# Patient Record
Sex: Female | Born: 1980 | Race: Black or African American | Hispanic: No | Marital: Single | Smoking: Never smoker
Health system: Southern US, Community
[De-identification: ages and names within clinical notes are randomized; demographics above are authoritative.]

---

## 2009-10-06 ENCOUNTER — Emergency Department (HOSPITAL_COMMUNITY): Admission: EM | Admit: 2009-10-06 | Discharge: 2009-10-06 | Payer: Self-pay | Admitting: Emergency Medicine

## 2014-09-02 ENCOUNTER — Encounter (HOSPITAL_BASED_OUTPATIENT_CLINIC_OR_DEPARTMENT_OTHER): Payer: Self-pay

## 2014-09-02 ENCOUNTER — Emergency Department (HOSPITAL_BASED_OUTPATIENT_CLINIC_OR_DEPARTMENT_OTHER): Payer: Medicaid Other

## 2014-09-02 ENCOUNTER — Emergency Department (HOSPITAL_BASED_OUTPATIENT_CLINIC_OR_DEPARTMENT_OTHER)
Admission: EM | Admit: 2014-09-02 | Discharge: 2014-09-02 | Disposition: A | Discharge status: Home / Self Care | Payer: Medicaid Other | Attending: Emergency Medicine | Admitting: Emergency Medicine

## 2014-09-02 DIAGNOSIS — O034 Incomplete spontaneous abortion without complication: Secondary | ICD-10-CM

## 2014-09-02 DIAGNOSIS — N939 Abnormal uterine and vaginal bleeding, unspecified: Secondary | ICD-10-CM

## 2014-09-02 DIAGNOSIS — IMO0002 Reserved for concepts with insufficient information to code with codable children: Secondary | ICD-10-CM

## 2014-09-02 LAB — URINALYSIS, ROUTINE W REFLEX MICROSCOPIC
Bilirubin Urine: NEGATIVE
GLUCOSE, UA: NEGATIVE mg/dL
KETONES UR: NEGATIVE mg/dL
LEUKOCYTES UA: NEGATIVE
NITRITE: NEGATIVE
Protein, ur: NEGATIVE mg/dL
Specific Gravity, Urine: 1.023 (ref 1.005–1.030)
Urobilinogen, UA: 1 mg/dL (ref 0.0–1.0)
pH: 5.5 (ref 5.0–8.0)

## 2014-09-02 LAB — CBC
HCT: 32.8 % — ABNORMAL LOW (ref 36.0–46.0)
Hemoglobin: 11.1 g/dL — ABNORMAL LOW (ref 12.0–15.0)
MCH: 26.9 pg (ref 26.0–34.0)
MCHC: 33.8 g/dL (ref 30.0–36.0)
MCV: 79.6 fL (ref 78.0–100.0)
Platelets: 345 10*3/uL (ref 150–400)
RBC: 4.12 MIL/uL (ref 3.87–5.11)
RDW: 14.8 % (ref 11.5–15.5)
WBC: 10.6 10*3/uL — ABNORMAL HIGH (ref 4.0–10.5)

## 2014-09-02 LAB — BASIC METABOLIC PANEL
Anion gap: 6 (ref 5–15)
BUN: 10 mg/dL (ref 6–23)
CALCIUM: 8.8 mg/dL (ref 8.4–10.5)
CO2: 21 mmol/L (ref 19–32)
Chloride: 109 mmol/L (ref 96–112)
Creatinine, Ser: 0.65 mg/dL (ref 0.50–1.10)
GLUCOSE: 99 mg/dL (ref 70–99)
POTASSIUM: 3.6 mmol/L (ref 3.5–5.1)
Sodium: 136 mmol/L (ref 135–145)

## 2014-09-02 LAB — URINE MICROSCOPIC-ADD ON

## 2014-09-02 LAB — HCG, QUANTITATIVE, PREGNANCY: hCG, Beta Chain, Quant, S: 846 m[IU]/mL — ABNORMAL HIGH (ref <5)

## 2014-09-02 MED ORDER — IBUPROFEN 800 MG PO TABS
800.0000 mg | ORAL_TABLET | Freq: Once | ORAL | Status: AC
  Administered 2014-09-02: 800 mg via ORAL
  Filled 2014-09-02: qty 1

## 2014-09-02 NOTE — ED Notes (Signed)
Pt wanting to know when is her u/s. Pt has drank maybe 2 oz of fluid and states she cannot drink anymore. Pt encouraged to drink.

## 2014-09-02 NOTE — ED Provider Notes (Signed)
CSN: [id number]     Arrival date & time 09/02/14  1335 History   First MD Initiated Contact with Patient 09/02/14 1405     Chief Complaint  Patient presents with  . Vaginal Bleeding     (Consider location/radiation/quality/duration/timing/severity/associated sxs/prior Treatment) HPI Comments: 34 year old female presenting with vaginal bleeding 1 day. Patient reports she had an elective [facility] 11 days [facility] [place] She had some brown spotting after the procedure, went away, and today, noticed the blood was "more red" with large clots. She tried calling [facility] and was advised to go to the emergency department. She was not scheduled follow-up. Admits to associated lower abdominal pain. Denies fever, chills, n/v/d.  Patient is a 34 y.o. female presenting with vaginal bleeding. The history is provided by the patient.  Vaginal Bleeding Associated symptoms: abdominal pain     History reviewed. No pertinent past medical history. History reviewed. No pertinent past surgical history. No family history on file. History  Substance Use Topics  . Smoking status: Never Smoker   . Smokeless tobacco: Not on file  . Alcohol Use: Yes   OB History    Gravida Para Term Preterm AB TAB SAB Ectopic Multiple Living   1              Review of Systems  Gastrointestinal: Positive for abdominal pain.  Genitourinary: Positive for vaginal bleeding.  All other systems reviewed and are negative.     Allergies  Review of patient's allergies indicates no known allergies.  Home Medications   Prior to Admission medications   Not on File   BP 113/60 mmHg  Pulse 73  Temp(Src) 98.8 F (37.1 C) (Oral)  Resp 16  Ht 5\' 3"  (1.6 m)  Wt 144 lb (65.318 kg)  BMI 25.51 kg/m2  SpO2 99%  LMP 06/02/2014 (Approximate) Physical Exam  Constitutional: She is oriented to person, place, and time. She appears well-developed and well-nourished. No distress.   HENT:  Head: Normocephalic and atraumatic.  Mouth/Throat: Oropharynx is clear and moist.  Eyes: Conjunctivae and EOM are normal.  Neck: Normal range of motion. Neck supple.  Cardiovascular: Normal rate, regular rhythm and normal heart sounds.   Pulmonary/Chest: Effort normal and breath sounds normal. No respiratory distress.  Abdominal: There is tenderness in the suprapubic area. There is no rigidity, no rebound and no guarding.  Genitourinary: Cervix exhibits no motion tenderness. There is bleeding (large clots) in the vagina.  Musculoskeletal: Normal range of motion. She exhibits no edema.  Neurological: She is alert and oriented to person, place, and time. No sensory deficit.  Skin: Skin is warm and dry.  Psychiatric: She has a normal mood and affect. Her behavior is normal.  Nursing note and vitals reviewed.   ED Course  Procedures (including critical care time) Labs Review Labs Reviewed  URINALYSIS, ROUTINE W REFLEX MICROSCOPIC - Abnormal; Notable for the following:    Hgb urine dipstick LARGE (*)    All other components within normal limits  CBC - Abnormal; Notable for the following:    WBC 10.6 (*)    Hemoglobin 11.1 (*)    HCT 32.8 (*)    All other components within normal limits  HCG, QUANTITATIVE, PREGNANCY - Abnormal; Notable for the following:    hCG, Beta Chain, Quant, S 846 (*)    All other components within normal limits  BASIC METABOLIC PANEL  URINE MICROSCOPIC-ADD ON    Imaging Review [place] Transvaginal Non-ob  09/02/2014   CLINICAL DATA:  Heavy vaginal bleeding.  EXAM: TRANSABDOMINAL AND TRANSVAGINAL ULTRASOUND OF PELVIS  TECHNIQUE: Both transabdominal and transvaginal ultrasound examinations of the pelvis were performed. Transabdominal technique was performed for global imaging of the pelvis including uterus, ovaries, adnexal regions, and pelvic cul-de-sac. It was necessary to proceed with endovaginal exam following the transabdominal exam to visualize the  endometrium and ovaries.  COMPARISON:  None  FINDINGS: Uterus  Measurements: 9.8 x 5.2 x 5.8 cm. No fibroids or other mass visualized.  Endometrium  Thickness: 18 mm. Heterogeneous and appearance. There is increased blood pro identified throughout the endometrium.  Right ovary  Measurements: 3.3 x 2.7 x 2.7 cm. The dominant follicle measures 12 mm.  Left ovary  Measurements: 3 x 1.4 x 2.0 cm. Normal appearance/no adnexal mass.  Other findings  No free fluid.  IMPRESSION: 1. Thickened and heterogeneous appearing endometrium with increased blood flow. Cannot cannot exclude retained products of conception.   Electronically Signed   By: Signa Kell M.D.   On: 09/02/2014 17:09   US Pelvis Complete  09/02/2014   CLINICAL DATA:  Heavy vaginal bleeding.  EXAM: TRANSABDOMINAL AND TRANSVAGINAL ULTRASOUND OF PELVIS  TECHNIQUE: Both transabdominal and transvaginal ultrasound examinations of the pelvis were performed. Transabdominal technique was performed for global imaging of the pelvis including uterus, ovaries, adnexal regions, and pelvic cul-de-sac. It was necessary to proceed with endovaginal exam following the transabdominal exam to visualize the endometrium and ovaries.  COMPARISON:  None  FINDINGS: Uterus  Measurements: 9.8 x 5.2 x 5.8 cm. No fibroids or other mass visualized.  Endometrium  Thickness: 18 mm. Heterogeneous and appearance. There is increased blood pro identified throughout the endometrium.  Right ovary  Measurements: 3.3 x 2.7 x 2.7 cm. The dominant follicle measures 12 mm.  Left ovary  Measurements: 3 x 1.4 x 2.0 cm. Normal appearance/no adnexal mass.  Other findings  No free fluid.  IMPRESSION: 1. Thickened and heterogeneous appearing endometrium with increased blood flow. Cannot cannot exclude retained products of conception.   Electronically Signed   By: Signa Kell M.D.   On: 09/02/2014 17:09     EKG Interpretation None      MDM   Final diagnoses:  Postoperative vaginal bleeding   Retained products of conception following abortion   Nontoxic appearing and in no apparent distress. Afebrile, vital signs stable. Abdomen soft with mild suprapubic tenderness. I was unable to get in touch with the staff at [facility] in [place]. Beta hCG 846. Ultrasound showing thickened and heterogeneous appearing endometrium with increased blood flow, cannot exclude retained products of conception. I discussed these findings with patient, and [facility], however patient states she needs to leave the emergency department to pick up her child, and will [facility] with [facility], if she cannot be seen there, she will go to [facility]. She is stable for discharge. Return precautions given. Patient states understanding of treatment care plan and is agreeable.   Kathrynn Speed, PA-C 09/02/14 1721  Tilden Fossa, MD 09/03/14 [phone]

## 2014-09-02 NOTE — ED Notes (Signed)
Elective abortion 2/12-vaginal bleeding x 1 day then light-heavy vaginal bleeding started again today

## 2014-09-02 NOTE — Discharge Instructions (Signed)
Follow-up with [facility] as soon as possible. If you cannot, follow-up at [facility] as soon as possible.  Dysfunctional Uterine Bleeding Normally, menstrual periods begin between ages 75 to 60 in young women. A normal menstrual cycle/period may begin every 23 days up to 35 days and lasts from 1 to 7 days. Around 12 to 14 days before your menstrual period starts, ovulation (ovary produces an egg) occurs. When counting the time between menstrual periods, count from the first day of bleeding of the previous period to the first day of bleeding of the next period. Dysfunctional (abnormal) uterine bleeding is bleeding that is different from a normal menstrual period. Your periods may come earlier or later than usual. They may be lighter, have blood clots or be heavier. You may have bleeding between periods, or you may skip one period or more. You may have bleeding after sexual intercourse, bleeding after menopause, or no menstrual period. CAUSES   Pregnancy (normal, miscarriage, tubal).  IUDs (intrauterine device, birth control).  Birth control pills.  Hormone treatment.  Menopause.  Infection of the cervix.  Blood clotting problems.  Infection of the inside lining of the uterus.  Endometriosis, inside lining of the uterus growing in the pelvis and other female organs.  Adhesions (scar tissue) inside the uterus.  Obesity or severe weight loss.  Uterine polyps inside the uterus.  Cancer of the vagina, cervix, or uterus.  Ovarian cysts or polycystic ovary syndrome.  Medical problems (diabetes, thyroid disease).  Uterine fibroids (noncancerous tumor).  Problems with your female hormones.  Endometrial hyperplasia, very thick lining and enlarged cells inside of the uterus.  Medicines that interfere with ovulation.  Radiation to the pelvis or abdomen.  Chemotherapy. DIAGNOSIS   Your doctor will discuss the history of your menstrual periods, medicines you are  taking, changes in your weight, stress in your life, and any medical problems you may have.  Your doctor will do a physical and pelvic examination.  Your doctor may want to perform certain tests to make a diagnosis, such as:  Pap test.  Blood tests.  Cultures for infection.  CT scan.  Ultrasound.  Hysteroscopy.  Laparoscopy.  MRI.  Hysterosalpingography.  D and C.  Endometrial biopsy. TREATMENT  Treatment will depend on the cause of the dysfunctional uterine bleeding (DUB). Treatment may include:  Observing your menstrual periods for a couple of months.  Prescribing medicines for medical problems, including:  Antibiotics.  Hormones.  Birth control pills.  Removing an IUD (intrauterine device, birth control).  Surgery:  D and C (scrape and remove tissue from inside the uterus).  Laparoscopy (examine inside the abdomen with a lighted tube).  Uterine ablation (destroy lining of the uterus with electrical current, laser, heat, or freezing).  Hysteroscopy (examine cervix and uterus with a lighted tube).  Hysterectomy (remove the uterus). HOME CARE INSTRUCTIONS   If medicines were prescribed, take exactly as directed. Do not change or switch medicines without consulting your caregiver.  Long term heavy bleeding may result in iron deficiency. Your caregiver may have prescribed iron pills. They help replace the iron that your body lost from heavy bleeding. Take exactly as directed.  Do not take aspirin or medicines that contain aspirin one week before or during your menstrual period. Aspirin may make the bleeding worse.  If you need to change your sanitary pad or tampon more than once every 2 hours, stay in bed with your feet elevated and a cold pack on your lower abdomen. Rest as  much as possible, until the bleeding stops or slows down.  Eat well-balanced meals. Eat foods high in iron. Examples are:  Leafy green vegetables.  Whole-grain breads and  cereals.  Eggs.  Meat.  Liver.  Do not try to lose weight until the abnormal bleeding has stopped and your blood iron level is back to normal. Do not lift more than ten pounds or do strenuous activities when you are bleeding.  For a couple of months, make note on your calendar, marking the start and ending of your period, and the type of bleeding (light, medium, heavy, spotting, clots or missed periods). This is for your caregiver to better evaluate your problem. SEEK MEDICAL CARE IF:   You develop nausea (feeling sick to your stomach) and vomiting, dizziness, or diarrhea while you are taking your medicine.  You are getting lightheaded or weak.  You have any problems that may be related to the medicine you are taking.  You develop pain with your DUB.  You want to remove your IUD.  You want to stop or change your birth control pills or hormones.  You have any type of abnormal bleeding mentioned above.  You are over 32 years old and have not had a menstrual period yet.  You are 34 years old and you are still having menstrual periods.  You have any of the symptoms mentioned above.  You develop a rash. SEEK IMMEDIATE MEDICAL CARE IF:   An oral temperature above 102 F (38.9 C) develops.  You develop chills.  You are changing your sanitary pad or tampon more than once an hour.  You develop abdominal pain.  You pass out or faint. Document Released: 06/24/2000 Document Revised: 09/19/2011 Document Reviewed: 05/26/2009 [facility] Patient Information 2015 [employer] [place]. This information is not intended to replace advice given to you by your health care provider. Make sure you discuss any questions you have with your health care provider.  Abnormal Uterine Bleeding Abnormal uterine bleeding can affect women at various stages in life, including teenagers, women in their reproductive years, pregnant women, and women who have reached menopause. Several kinds of uterine bleeding  are considered abnormal, including:  Bleeding or spotting between periods.   Bleeding after sexual intercourse.   Bleeding that is heavier or more than normal.   Periods that last longer than usual.  Bleeding after menopause.  Many cases of abnormal uterine bleeding are minor and simple to treat, while others are more serious. Any type of abnormal bleeding should be evaluated by your health care provider. Treatment will depend on the cause of the bleeding. HOME CARE INSTRUCTIONS Monitor your condition for any changes. The following actions may help to alleviate any discomfort you are experiencing:  Avoid the use of tampons and douches as directed by your health care provider.  Change your pads frequently. You should get regular pelvic exams and Pap tests. Keep all follow-up appointments for diagnostic tests as directed by your health care provider.  SEEK MEDICAL CARE IF:   Your bleeding lasts more than 1 week.   You feel dizzy at times.  SEEK IMMEDIATE MEDICAL CARE IF:   You pass out.   You are changing pads every 15 to 30 minutes.   You have abdominal pain.  You have a fever.   You become sweaty or weak.   You are passing large blood clots from the vagina.   You start to feel nauseous and vomit. MAKE SURE YOU:   Understand these instructions.  Will  watch your condition.  Will get help right away if you are not doing well or get worse. Document Released: 06/27/2005 Document Revised: 07/02/2013 Document Reviewed: 01/24/2013 [facility] Patient Information 2015 [place], [place]. This information is not intended to replace advice given to you by your health care provider. Make sure you discuss any questions you have with your health care provider.

## 2014-09-02 NOTE — ED Notes (Signed)
Pt given 2 large waters, 2 gingerale sodas to drink of u/s.

## 2018-07-10 ENCOUNTER — Other Ambulatory Visit: Payer: Self-pay

## 2018-07-10 ENCOUNTER — Encounter (HOSPITAL_BASED_OUTPATIENT_CLINIC_OR_DEPARTMENT_OTHER): Payer: Self-pay | Admitting: *Deleted

## 2018-07-10 ENCOUNTER — Emergency Department (HOSPITAL_BASED_OUTPATIENT_CLINIC_OR_DEPARTMENT_OTHER): Payer: Medicaid Other

## 2018-07-10 ENCOUNTER — Emergency Department (HOSPITAL_BASED_OUTPATIENT_CLINIC_OR_DEPARTMENT_OTHER)
Admission: EM | Admit: 2018-07-10 | Discharge: 2018-07-10 | Disposition: A | Discharge status: Home / Self Care | Payer: Medicaid Other | Attending: Emergency Medicine | Admitting: Emergency Medicine

## 2018-07-10 DIAGNOSIS — J111 Influenza due to unidentified influenza virus with other respiratory manifestations: Secondary | ICD-10-CM

## 2018-07-10 DIAGNOSIS — R69 Illness, unspecified: Secondary | ICD-10-CM

## 2018-07-10 DIAGNOSIS — R05 Cough: Secondary | ICD-10-CM | POA: Diagnosis present

## 2018-07-10 LAB — CBC
HCT: 39.3 % (ref 36.0–46.0)
HEMOGLOBIN: 12.4 g/dL (ref 12.0–15.0)
MCH: 26.2 pg (ref 26.0–34.0)
MCHC: 31.6 g/dL (ref 30.0–36.0)
MCV: 83.1 fL (ref 80.0–100.0)
Platelets: 269 10*3/uL (ref 150–400)
RBC: 4.73 MIL/uL (ref 3.87–5.11)
RDW: 14.1 % (ref 11.5–15.5)
WBC: 6.8 10*3/uL (ref 4.0–10.5)
nRBC: 0 % (ref 0.0–0.2)

## 2018-07-10 LAB — BASIC METABOLIC PANEL
ANION GAP: 7 (ref 5–15)
BUN: 11 mg/dL (ref 6–20)
CO2: 26 mmol/L (ref 22–32)
Calcium: 8.9 mg/dL (ref 8.9–10.3)
Chloride: 103 mmol/L (ref 98–111)
Creatinine, Ser: 0.78 mg/dL (ref 0.44–1.00)
GFR calc Af Amer: >60 mL/min (ref >60)
GFR calc non Af Amer: >60 mL/min (ref >60)
Glucose, Bld: 93 mg/dL (ref 70–99)
Potassium: 3.9 mmol/L (ref 3.5–5.1)
SODIUM: 136 mmol/L (ref 135–145)

## 2018-07-10 MED ORDER — IBUPROFEN 400 MG PO TABS
400.0000 mg | ORAL_TABLET | Freq: Once | ORAL | Status: AC
  Administered 2018-07-10: 400 mg via ORAL
  Filled 2018-07-10: qty 1

## 2018-07-10 MED ORDER — NAPROXEN 500 MG PO TABS: 500.0000 mg | ORAL_TABLET | Freq: Two times a day (BID) | ORAL | 0 refills | Status: AC

## 2018-07-10 NOTE — ED Triage Notes (Signed)
Cough and body aches x 1 week. Chills.

## 2018-07-10 NOTE — ED Provider Notes (Signed)
[facility] EMERGENCY DEPARTMENT Provider Note   CSN: [id number] Arrival date & time: 07/10/18  1142     History   Chief Complaint Chief Complaint  Patient presents with  . Cough    HPI [facility] is a 37 y.o. female.  HPI Patient started having trouble with cough and body aches about a week ago.  She has had a mild sore throat and has felt chilled.  She has not measured a fever.  She denies any vomiting or diarrhea.  No dysuria.  She started having increasing myalgias where it made it hard for her to get up and walk around.  Patient decided come to the ED for evaluation. History reviewed. No pertinent past medical history.  There are no active problems to display for this patient.   History reviewed. No pertinent surgical history.   OB History    Gravida  1   Para      Term      Preterm      AB      Living        SAB      TAB      Ectopic      Multiple      Live Births               Home Medications    Prior to Admission medications   Medication Sig Start Date End Date Taking? Authorizing Provider  naproxen (NAPROSYN) 500 MG tablet Take 1 tablet (500 mg total) by mouth 2 (two) times daily with a meal. As needed for pain 07/10/18   Linwood DibblesKnapp, Adelee Hannula, MD    Family History No family history on file.  Social History Social History   Tobacco Use  . Smoking status: Never Smoker  . Smokeless tobacco: Never Used  Substance Use Topics  . Alcohol use: Yes  . Drug use: Not on file     Allergies   Patient has no known allergies.   Review of Systems Review of Systems  All other systems reviewed and are negative.    Physical Exam Updated Vital Signs BP (!) 120/91 (BP Location: Right Arm)   Pulse 96   Temp 99.1 F (37.3 C) (Oral)   Resp 14   Ht 1.626 m (5\' 4" )   Wt 74.8 kg   SpO2 100%   Breastfeeding Unknown   BMI 28.31 kg/m   Physical Exam Vitals signs and nursing note reviewed.  Constitutional:      General: She  is not in acute distress.    Appearance: She is well-developed.  HENT:     Head: Normocephalic and atraumatic.     Right Ear: External ear normal.     Left Ear: External ear normal.  Eyes:     General: No scleral icterus.       Right eye: No discharge.        Left eye: No discharge.     Conjunctiva/sclera: Conjunctivae normal.  Neck:     Musculoskeletal: Neck supple.     Trachea: No tracheal deviation.  Cardiovascular:     Rate and Rhythm: Normal rate and regular rhythm.  Pulmonary:     Effort: Pulmonary effort is normal. No respiratory distress.     Breath sounds: Normal breath sounds. No stridor. No wheezing or rales.  Abdominal:     General: Bowel sounds are normal. There is no distension.     Palpations: Abdomen is soft.     Tenderness: There is no  abdominal tenderness. There is no guarding or rebound.  Musculoskeletal:        General: No tenderness.  Skin:    General: Skin is warm and dry.     Findings: No rash.  Neurological:     Mental Status: She is alert.     Cranial Nerves: No cranial nerve deficit (no facial droop, extraocular movements intact, no slurred speech).     Sensory: No sensory deficit.     Motor: No abnormal muscle tone or seizure activity.     Coordination: Coordination normal.      ED Treatments / Results  Labs (all labs ordered are listed, but only abnormal results are displayed) Labs Reviewed  CBC  BASIC METABOLIC PANEL     Radiology Dg Chest 2 View  Result Date: 07/10/2018 CLINICAL DATA:  Fever, cough and congestion EXAM: CHEST - 2 VIEW COMPARISON:  None. FINDINGS: The heart size and mediastinal contours are within normal limits. Both lungs are clear. The visualized skeletal structures are unremarkable. IMPRESSION: No active cardiopulmonary disease. Electronically Signed   By: Sherian ReinWei-Chen  Lin M.D.   On: 07/10/2018 14:25    Procedures Procedures (including critical care time)  Medications Ordered in ED Medications  ibuprofen  (ADVIL,MOTRIN) tablet 400 mg (400 mg Oral Given 07/10/18 1202)     Initial Impression / Assessment and Plan / ED Course  I have reviewed the triage vital signs and the nursing notes.  Pertinent labs & imaging results that were available during my care of the patient were reviewed by me and considered in my medical decision making (see chart for details).   Patient presented to the emergency room with complaints of cough congestion and myalgias.  Symptoms are suggestive of an influenza-like illness chest suspect is the cause of her myalgias..  Her symptoms have been ongoing for 1 week.  Her laboratory tests are reassuring.  Her chest x-ray is normal.  We discussed supportive treatment with NSAIDs.  Follow-up with her primary doctor if not resolved in the next week  Final Clinical Impressions(s) / ED Diagnoses   Final diagnoses:  Influenza-like illness    ED Discharge Orders         Ordered    naproxen (NAPROSYN) 500 MG tablet  2 times daily with meals     07/10/18 1524           Linwood DibblesKnapp, Titus Drone, MD 07/10/18 1525

## 2018-07-10 NOTE — Discharge Instructions (Addendum)
Take the medications for aches and pains.  Your symptoms should be improving over the next several days to week.  Follow-up with a primary care doctor if your symptoms do not resolve.

## 2018-09-08 ENCOUNTER — Emergency Department (HOSPITAL_BASED_OUTPATIENT_CLINIC_OR_DEPARTMENT_OTHER)
Admission: EM | Admit: 2018-09-08 | Discharge: 2018-09-08 | Disposition: A | Discharge status: Home / Self Care | Payer: Medicaid Other | Attending: Emergency Medicine | Admitting: Emergency Medicine

## 2018-09-08 ENCOUNTER — Other Ambulatory Visit: Payer: Self-pay

## 2018-09-08 ENCOUNTER — Encounter (HOSPITAL_BASED_OUTPATIENT_CLINIC_OR_DEPARTMENT_OTHER): Payer: Self-pay | Admitting: *Deleted

## 2018-09-08 DIAGNOSIS — N898 Other specified noninflammatory disorders of vagina: Secondary | ICD-10-CM

## 2018-09-08 DIAGNOSIS — N760 Acute vaginitis: Secondary | ICD-10-CM | POA: Insufficient documentation

## 2018-09-08 DIAGNOSIS — B9689 Other specified bacterial agents as the cause of diseases classified elsewhere: Secondary | ICD-10-CM

## 2018-09-08 LAB — URINALYSIS, ROUTINE W REFLEX MICROSCOPIC
BILIRUBIN URINE: NEGATIVE
Glucose, UA: NEGATIVE mg/dL
Ketones, ur: 15 mg/dL — AB
Leukocytes,Ua: NEGATIVE
Nitrite: NEGATIVE
Protein, ur: NEGATIVE mg/dL
pH: 5.5 (ref 5.0–8.0)

## 2018-09-08 LAB — URINALYSIS, MICROSCOPIC (REFLEX)

## 2018-09-08 LAB — WET PREP, GENITAL
SPERM: NONE SEEN
Trich, Wet Prep: NONE SEEN
Yeast Wet Prep HPF POC: NONE SEEN

## 2018-09-08 LAB — PREGNANCY, URINE: Preg Test, Ur: NEGATIVE

## 2018-09-08 MED ORDER — METRONIDAZOLE 500 MG PO TABS: 500.0000 mg | ORAL_TABLET | Freq: Two times a day (BID) | ORAL | 0 refills | Status: AC

## 2018-09-08 NOTE — ED Provider Notes (Signed)
[facility] EMERGENCY DEPARTMENT Provider Note   CSN: [id number] Arrival date & time: 09/08/18  1520    History   Chief Complaint Chief Complaint  Patient presents with  . Vaginal Discharge    HPI Elizabeth Goodman is a 38 y.o. female.     HPI   Pt is a 38 y/o female with no PMHx who presents to the ED today for evaluation of vaginal discharge that began 2 days ago. Notes that discharge is white. There is associated vaginal odor and irritation/itchiness. Denies abd pain, NVD, constipation, or urinary sxs. No fevers or chills. Notes she had unprotected intercourse with her partner 2 days ago.  History reviewed. No pertinent past medical history.  There are no active problems to display for this patient.   History reviewed. No pertinent surgical history.   OB History    Gravida  1   Para      Term      Preterm      AB      Living        SAB      TAB      Ectopic      Multiple      Live Births               Home Medications    Prior to Admission medications   Medication Sig Start Date End Date Taking? Authorizing Provider  metroNIDAZOLE (FLAGYL) 500 MG tablet Take 1 tablet (500 mg total) by mouth 2 (two) times daily for 7 days. 09/08/18 09/15/18  Qamar Aughenbaugh S, PA-C  naproxen (NAPROSYN) 500 MG tablet Take 1 tablet (500 mg total) by mouth 2 (two) times daily with a meal. As needed for pain 07/10/18   Linwood Dibbles, MD    Family History No family history on file.  Social History Social History   Tobacco Use  . Smoking status: Never Smoker  . Smokeless tobacco: Never Used  Substance Use Topics  . Alcohol use: Yes    Comment: 2 drinks/ daily  . Drug use: Never     Allergies   Patient has no known allergies.   Review of Systems Review of Systems  Constitutional: Negative for fever.  HENT: Negative for ear pain and sore throat.   Eyes: Negative for pain and visual disturbance.  Respiratory: Negative for cough and shortness of  breath.   Cardiovascular: Negative for chest pain.  Gastrointestinal: Negative for abdominal pain, constipation, diarrhea, nausea and vomiting.  Genitourinary: Positive for vaginal discharge. Negative for decreased urine volume, dysuria, frequency, hematuria, pelvic pain and vaginal bleeding.  Musculoskeletal: Negative for back pain.  Skin: Negative for rash.  Neurological: Negative for headaches.  All other systems reviewed and are negative.  Physical Exam Updated Vital Signs BP 137/89 (BP Location: Left Arm)   Pulse 89   Temp 98.7 F (37.1 C) (Oral)   Resp 18   Ht 5\' 3"  (1.6 m)   Wt 72.6 kg   SpO2 100%   Breastfeeding No   BMI 28.34 kg/m   Physical Exam Vitals signs and nursing note reviewed.  Constitutional:      General: She is not in acute distress.    Appearance: She is well-developed.  HENT:     Head: Normocephalic and atraumatic.  Eyes:     Conjunctiva/sclera: Conjunctivae normal.  Neck:     Musculoskeletal: Neck supple.  Cardiovascular:     Rate and Rhythm: Normal rate and regular rhythm.  Heart sounds: No murmur.  Pulmonary:     Effort: Pulmonary effort is normal. No respiratory distress.     Breath sounds: Normal breath sounds.  Abdominal:     General: Bowel sounds are normal.     Palpations: Abdomen is soft.     Tenderness: There is no abdominal tenderness. There is no right CVA tenderness, left CVA tenderness, guarding or rebound.  Genitourinary:    Comments: Exam performed by Karrie Meresortni S Nikolaj Geraghty,  exam chaperoned Date: 09/08/2018 Pelvic exam: normal external genitalia without evidence of trauma. VULVA: normal appearing vulva with no masses, tenderness or lesion. VAGINA: normal appearing vagina with normal color and no lesions. Discharge present as noted below. CERVIX: normal appearing cervix without lesions, cervical motion tenderness absent, cervical os closed; there is copious white/yellow discharge present, Wet prep and DNA probe for chlamydia and GC  obtained.   ADNEXA: normal adnexa in size, nontender and no masses UTERUS: uterus is normal size, shape, consistency and nontender.  Skin:    General: Skin is warm and dry.  Neurological:     Mental Status: She is alert.    ED Treatments / Results  Labs (all labs ordered are listed, but only abnormal results are displayed) Labs Reviewed  WET PREP, GENITAL - Abnormal; Notable for the following components:      Result Value   Clue Cells Wet Prep HPF POC PRESENT (*)    WBC, Wet Prep HPF POC MANY (*)    All other components within normal limits  URINALYSIS, ROUTINE W REFLEX MICROSCOPIC - Abnormal; Notable for the following components:   Specific Gravity, Urine >1.030 (*)    Hgb urine dipstick TRACE (*)    Ketones, ur 15 (*)    All other components within normal limits  URINALYSIS, MICROSCOPIC (REFLEX) - Abnormal; Notable for the following components:   Bacteria, UA FEW (*)    All other components within normal limits  PREGNANCY, URINE  GC/CHLAMYDIA PROBE AMP ([place]) NOT [facility]    EKG None  Radiology No results found.  Procedures Procedures (including critical care time)  Medications Ordered in ED Medications - No data to display   Initial Impression / Assessment and Plan / ED Course  I have reviewed the triage vital signs and the nursing notes.  Pertinent labs & imaging results that were available during my care of the patient were reviewed by me and considered in my medical decision making (see chart for details).     Final Clinical Impressions(s) / ED Diagnoses   Final diagnoses:  Vaginal discharge  Bacterial vaginosis   Patient to be discharged with instructions to follow up with pcp. Discussed importance of using protection when sexually active. Pt understands that they have GC/Chlamydia cultures pending and that they will need to inform all sexual partners if results return positive.  I offered prophylactic treatment with azithromycin and Rocephin  however patient declined treatment and would rather wait for results.  Her wet prep does shows clue cells and white blood cells therefore will treat for suspected bacterial vaginosis given her reports of odor and irritation. Pt not concerning for PID because hemodynamically stable and no cervical motion tenderness on pelvic exam. Pt has also been treated with flagyl for Bacterial Vaginosis. Pt has been advised to not drink alcohol while on this medication.  Advised to return the ER for new or worsening symptoms.  She voiced understanding the plan and reasons to return but all questions answered.  Patient stable for discharge.  ED Discharge Orders         Ordered    metroNIDAZOLE (FLAGYL) 500 MG tablet  2 times daily     09/08/18 1700           Karrie Meres, PA-C 09/08/18 1701    Linwood Dibbles, MD 09/08/18 2214

## 2018-09-08 NOTE — ED Triage Notes (Signed)
Pt reports she had sex with her usual partner without a condom on Thursday and now has vaginal odor, discharge, and back pain

## 2018-09-08 NOTE — Discharge Instructions (Addendum)
Your urinalysis did not know any evidence of a urinary tract infection.  Your wet prep does show signs that you may have bacterial vaginosis and therefore you will be treated with antibiotics.  Do not drink alcohol with these antibiotics as it will make you feel very sick.  You have been tested for chlamydia and gonorrhea.  These results will be available in approximately 3 days and you will be contacted by the hospital if the results are positive. Avoid sexual contact until you are aware of the results, and please inform all sexual partners if you test positive for any of these diseases.  Please follow up with your primary care provider within 5-7 days for re-evaluation of your symptoms. Please return to the emergency department for any new or worsening symptoms.

## 2018-09-10 LAB — GC/CHLAMYDIA PROBE AMP (~~LOC~~) NOT AT ARMC
Chlamydia: NEGATIVE
Neisseria Gonorrhea: NEGATIVE

## 2018-10-11 ENCOUNTER — Emergency Department (HOSPITAL_BASED_OUTPATIENT_CLINIC_OR_DEPARTMENT_OTHER): Payer: Medicaid Other

## 2018-10-11 ENCOUNTER — Other Ambulatory Visit: Payer: Self-pay

## 2018-10-11 ENCOUNTER — Encounter (HOSPITAL_BASED_OUTPATIENT_CLINIC_OR_DEPARTMENT_OTHER): Payer: Self-pay | Admitting: Emergency Medicine

## 2018-10-11 ENCOUNTER — Emergency Department (HOSPITAL_BASED_OUTPATIENT_CLINIC_OR_DEPARTMENT_OTHER)
Admission: EM | Admit: 2018-10-11 | Discharge: 2018-10-11 | Disposition: A | Discharge status: Home / Self Care | Payer: Medicaid Other | Attending: Emergency Medicine | Admitting: Emergency Medicine

## 2018-10-11 DIAGNOSIS — J069 Acute upper respiratory infection, unspecified: Secondary | ICD-10-CM | POA: Diagnosis not present

## 2018-10-11 DIAGNOSIS — R0602 Shortness of breath: Secondary | ICD-10-CM | POA: Diagnosis present

## 2018-10-11 LAB — BRAIN NATRIURETIC PEPTIDE: B Natriuretic Peptide: 13 pg/mL (ref 0.0–100.0)

## 2018-10-11 LAB — BASIC METABOLIC PANEL
Anion gap: 12 (ref 5–15)
BUN: 12 mg/dL (ref 6–20)
CO2: 21 mmol/L — ABNORMAL LOW (ref 22–32)
Calcium: 8.8 mg/dL — ABNORMAL LOW (ref 8.9–10.3)
Chloride: 104 mmol/L (ref 98–111)
Creatinine, Ser: 0.9 mg/dL (ref 0.44–1.00)
GFR calc Af Amer: >60 mL/min (ref >60)
GFR calc non Af Amer: >60 mL/min (ref >60)
Glucose, Bld: 109 mg/dL — ABNORMAL HIGH (ref 70–99)
Potassium: 3.1 mmol/L — ABNORMAL LOW (ref 3.5–5.1)
Sodium: 137 mmol/L (ref 135–145)

## 2018-10-11 LAB — PREGNANCY, URINE: Preg Test, Ur: NEGATIVE

## 2018-10-11 LAB — CBC WITH DIFFERENTIAL/PLATELET
Abs Immature Granulocytes: 0.01 10*3/uL (ref 0.00–0.07)
Basophils Absolute: 0 10*3/uL (ref 0.0–0.1)
Basophils Relative: 0 %
Eosinophils Absolute: 0.1 10*3/uL (ref 0.0–0.5)
Eosinophils Relative: 2 %
HCT: 40.3 % (ref 36.0–46.0)
Hemoglobin: 12.9 g/dL (ref 12.0–15.0)
Immature Granulocytes: 0 %
Lymphocytes Relative: 35 %
Lymphs Abs: 3.4 10*3/uL (ref 0.7–4.0)
MCH: 26.5 pg (ref 26.0–34.0)
MCHC: 32 g/dL (ref 30.0–36.0)
MCV: 82.8 fL (ref 80.0–100.0)
Monocytes Absolute: 0.6 10*3/uL (ref 0.1–1.0)
Monocytes Relative: 6 %
Neutro Abs: 5.5 10*3/uL (ref 1.7–7.7)
Neutrophils Relative %: 57 %
Platelets: 334 10*3/uL (ref 150–400)
RBC: 4.87 MIL/uL (ref 3.87–5.11)
RDW: 13.8 % (ref 11.5–15.5)
WBC: 9.6 10*3/uL (ref 4.0–10.5)
nRBC: 0 % (ref 0.0–0.2)

## 2018-10-11 LAB — TROPONIN I: Troponin I: <0.03 ng/mL (ref <0.03)

## 2018-10-11 MED ORDER — SODIUM CHLORIDE 0.9 % IV BOLUS
1000.0000 mL | Freq: Once | INTRAVENOUS | Status: AC
  Administered 2018-10-11: 1000 mL via INTRAVENOUS

## 2018-10-11 MED ORDER — IOHEXOL 350 MG/ML SOLN
100.0000 mL | Freq: Once | INTRAVENOUS | Status: AC | PRN
  Administered 2018-10-11: 100 mL via INTRAVENOUS

## 2018-10-11 MED ORDER — SODIUM CHLORIDE 0.9 % IV BOLUS
500.0000 mL | Freq: Once | INTRAVENOUS | Status: AC
  Administered 2018-10-11: 500 mL via INTRAVENOUS

## 2018-10-11 MED ORDER — ACETAMINOPHEN 325 MG PO TABS
650.0000 mg | ORAL_TABLET | Freq: Once | ORAL | Status: AC
  Administered 2018-10-11: 650 mg via ORAL
  Filled 2018-10-11: qty 2

## 2018-10-11 NOTE — ED Notes (Signed)
Patient transported to CT 

## 2018-10-11 NOTE — Discharge Instructions (Addendum)
You have been diagnosed today with Viral Upper Respiratory Illness.  At this time there does not appear to be the presence of an emergent medical condition, however there is always the potential for conditions to change. Please read and follow the below instructions.  Please return to the Emergency Department immediately for any new or worsening symptoms. Please be sure to follow up with your Primary Care Provider within one week regarding your visit today; please call their office to schedule an appointment even if you are feeling better for a follow-up visit. As we discussed per current guidelines testing for the coronavirus is not indicated at this time.  We still recommend that you self quarantine for the next 2 weeks. It is likely that you are dehydrated today, please be sure to drink plenty of water.  Get plenty of rest over the next few days.  You may use Tylenol as directed on the packaging to help with your symptoms.  Please call your primary care provider tomorrow to schedule a follow-up appointment.  Return immediately to the emergency department for any new or worsening symptoms. Please finish your antibiotic as prescribed by your primary care provider.  You may also use the albuterol that they gave you every 2 hours as needed.  If you feel that this medication is not helping or that you are using it too often please return to the emergency department.  Get help right away if: You feel pain or pressure in your chest. You have shortness of breath. You faint or feel like you will faint. You keep throwing up (vomiting). You feel confused. Any new or concerning symptoms.  Please read the additional information packets attached to your discharge summary.  Do not take your medicine if  develop an itchy rash, swelling in your mouth or lips, or difficulty breathing.  ================  If you live with, or provide care at home for, a person confirmed to have, or being evaluated for,  COVID-19 infection please follow these guidelines to prevent infection:  Follow healthcare providers instructions Make sure that you understand and can help the patient follow any healthcare provider instructions for all care.  Provide for the patients basic needs You should help the patient with basic needs in the home and provide support for getting groceries, prescriptions, and other personal needs.  Monitor the patients symptoms If they are getting sicker, call his or her medical provider a  This will help the healthcare providers office take steps to keep other people from getting infected. Ask the healthcare provider to call the local or state health department.  Limit the number of people who have contact with the patient If possible, have only one caregiver for the patient. Other household members should stay in another home or place of residence. If this is not possible, they should stay in another room, or be separated from the patient as much as possible. Use a separate bathroom, if available. Restrict visitors who do not have an essential need to be in the home.  Keep older adults, very young children, and other sick people away from the patient Keep older adults, very young children, and those who have compromised immune systems or chronic health conditions away from the patient. This includes people with chronic heart, lung, or kidney conditions, diabetes, and cancer.  Ensure good ventilation Make sure that shared spaces in the home have good air flow, such as from an air conditioner or an opened window, weather permitting.  Wash your hands often  Wash your hands often and thoroughly with soap and water for at least 20 seconds. You can use an alcohol based hand sanitizer if soap and water are not available and if your hands are not visibly dirty. Avoid touching your eyes, nose, and mouth with unwashed hands. Use disposable paper towels to dry your hands. If not  available, use dedicated cloth towels and replace them when they become wet.  Wear a facemask and gloves Wear a disposable facemask at all times in the room and gloves when you touch or have contact with the patients blood, body fluids, and/or secretions or excretions, such as sweat, saliva, sputum, nasal mucus, vomit, urine, or feces.  Ensure the mask fits over your nose and mouth tightly, and do not touch it during use. Throw out disposable facemasks and gloves after using them. Do not reuse. Wash your hands immediately after removing your facemask and gloves. If your personal clothing becomes contaminated, carefully remove clothing and launder. Wash your hands after handling contaminated clothing. Place all used disposable facemasks, gloves, and other waste in a lined container before disposing them with other household waste. Remove gloves and wash your hands immediately after handling these items.  Do not share dishes, glasses, or other household items with the patient Avoid sharing household items. You should not share dishes, drinking glasses, cups, eating utensils, towels, bedding, or other items After the person uses these items, you should wash them thoroughly with soap and water.  Wash laundry thoroughly Immediately remove and wash clothes or bedding that have blood, body fluids, and/or secretions or excretions, such as sweat, saliva, sputum, nasal mucus, vomit, urine, or feces, on them. Wear gloves when handling laundry from the patient. Read and follow directions on labels of laundry or clothing items and detergent. In general, wash and dry with the warmest temperatures recommended on the label.  Clean all areas the individual has used often Clean all touchable surfaces, such as counters, tabletops, doorknobs, bathroom fixtures, toilets, phones, keyboards, tablets, and bedside tables, every day. Also, clean any surfaces that may have blood, body fluids, and/or secretions or  excretions on them. Wear gloves when cleaning surfaces the patient has come in contact with. Use a diluted bleach solution (e.g., dilute bleach with 1 part bleach and 10 parts water) or a household disinfectant with a label that says EPA-registered for coronaviruses. To make a bleach solution at home, add 1 tablespoon of bleach to 1 quart (4 cups) of water. For a larger supply, add  cup of bleach to 1 gallon (16 cups) of water. Read labels of cleaning products and follow recommendations provided on product labels. Labels contain instructions for safe and effective use of the cleaning product including precautions you should take when applying the product, such as wearing gloves or eye protection and making sure you have good ventilation during use of the product. Remove gloves and wash hands immediately after cleaning.  Monitor yourself for signs and symptoms of illness Caregivers and household members are considered close contacts, should monitor their health, and will be asked to limit movement outside of the home to the extent possible. Follow the monitoring steps for close contacts listed on the symptom monitoring form.   ? If you have additional questions, contact your local health department or call the epidemiologist on call at [phone] (available 24/7). ? This guidance is subject to change. For the most up-to-date guidance from [facility], please refer to their website: [email or web address]

## 2018-10-11 NOTE — ED Provider Notes (Addendum)
[facility] EMERGENCY DEPARTMENT Provider Note   CSN: [id number] Arrival date & time: 10/11/18  1613    History   Chief Complaint Chief Complaint  Patient presents with  . Shortness of Breath    HPI Elizabeth Goodman is a 38 y.o. female presented today for 7 days of shortness of breath.  Patient reports that 7 days ago she began having rhinorrhea, congestion and shortness of breath worsened with exertion.  Patient has had multiple telemedicine visits with her primary care provider she has tried amoxicillin, albuterol, Mucinex and Claritin with minimal relief of her symptoms.  She reports that over the past 2 days she has additionally developed a central chest pressure constant worsened with deep breaths and without alleviating factors.  Patient has last used her albuterol yesterday afternoon despite feeling short of breath today she did not attempt to use this medication.  Patient denies history of cough, hemoptysis, extremity swelling/pain, history of PE/DVT, history of cancer, recent immobilization/surgery or injury.  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     HPI  History reviewed. No pertinent past medical history.  There are no active problems to display for this patient.   History reviewed. No pertinent surgical history.   OB History    Gravida  1   Para      Term      Preterm      AB      Living        SAB      TAB      Ectopic      Multiple      Live Births               Home Medications    Prior to Admission medications   Medication Sig Start Date End Date Taking? Authorizing Provider  naproxen (NAPROSYN) 500 MG tablet Take 1 tablet (500 mg total) by mouth 2 (two) times daily with a meal. As needed for pain 07/10/18   Linwood Dibbles, MD    Family History No family history on file.   Social History Social History   Tobacco Use  . Smoking status: Never Smoker  . Smokeless tobacco: Never Used  Substance Use Topics  . Alcohol use: Yes    Comment: 2 drinks/ daily  . Drug use: Never     Allergies   Patient has no known allergies.   Review of Systems Review of Systems  Constitutional: Positive for fever. Negative for appetite change and chills.  HENT: Positive for rhinorrhea. Negative for congestion, facial swelling, sore throat, trouble swallowing and voice change.   Respiratory: Positive for shortness of breath. Negative for cough.   Cardiovascular: Negative.  Negative for chest pain, palpitations and leg swelling.  Gastrointestinal: Negative.  Negative for abdominal pain, diarrhea, nausea and vomiting.  Genitourinary: Negative.  Negative for dysuria, hematuria, vaginal bleeding and vaginal discharge.  Musculoskeletal: Negative.  Negative for arthralgias, back pain, myalgias and neck pain.  Neurological: Negative.  Negative for weakness and headaches.  All other systems reviewed and are negative.    Physical Exam Updated Vital Signs BP 130/75 (BP Location: Right Arm)   Pulse 83   Temp 99.6 F (37.6 C) (Oral)   Resp 16   Ht 5\' 3"  (1.6 m)   Wt 72.6 kg   SpO2 100%   BMI 28.34 kg/m  Physical Exam Constitutional:      General: She is not in acute distress.    Appearance: Normal appearance. She is well-developed. She is not ill-appearing or diaphoretic.  HENT:     Head: Normocephalic and atraumatic.     Jaw: There is normal jaw occlusion. No trismus.     Right Ear: Tympanic membrane, ear canal and external ear normal.     Left Ear: Tympanic membrane, ear canal and external ear normal.     Nose: Rhinorrhea present. Rhinorrhea is clear.     Right Sinus: No maxillary sinus tenderness or frontal sinus tenderness.     Left Sinus: No maxillary sinus tenderness or frontal sinus tenderness.     Mouth/Throat:     Lips: Pink.     Mouth: Mucous membranes  are moist.     Pharynx: Oropharynx is clear. Uvula midline.     Comments: Patient with mild posterior oropharynx cobblestoning consistent with postnasal drip.  The patient has normal phonation and is in control of secretions. No stridor.  Midline uvula without edema. Soft palate rises symmetrically. No tonsillar erythema, swelling or exudates. Tongue protrusion is normal, floor of mouth is soft. No trismus. No creptius on neck palpation. No gingival erythema or fluctuance noted. Mucus membranes moist.  Eyes:     General: Vision grossly intact. Gaze aligned appropriately.     Conjunctiva/sclera: Conjunctivae normal.     Pupils: Pupils are equal, round, and reactive to light.  Neck:     Musculoskeletal: Full passive range of motion without pain, normal range of motion and neck supple.     Trachea: Trachea and phonation normal. No tracheal tenderness or tracheal deviation.  Cardiovascular:     Rate and Rhythm: Regular rhythm. Tachycardia present.     Pulses:          Dorsalis pedis pulses are 2+ on the right side and 2+ on the left side.       Posterior tibial pulses are 2+ on the right side and 2+ on the left side.  Pulmonary:     Effort: Pulmonary effort is normal. No accessory muscle usage or respiratory distress.     Breath sounds: Normal breath sounds and air entry. No decreased breath sounds, wheezing or rhonchi.  Chest:     Chest wall: No deformity, tenderness or crepitus.  Abdominal:     General: There is no distension.     Palpations: Abdomen is soft.     Tenderness: There is no abdominal tenderness. There is no guarding or rebound.  Genitourinary:    Comments: Deferred by patient Musculoskeletal: Normal range of motion.     Right lower leg: Normal. No edema.     Left lower leg: Normal. No edema.  Feet:     Right foot:     Protective Sensation: 3 sites tested. 3 sites sensed.     Left foot:     Protective Sensation: 3 sites tested. 3 sites sensed.  Skin:    General: Skin is  warm and dry.  Neurological:     Mental Status: She is alert.     GCS: GCS eye subscore is 4. GCS verbal subscore is 5. GCS motor subscore is 6.     Comments: Speech is clear and goal oriented, follows commands Major Cranial nerves without deficit, no facial droop Moves extremities without ataxia, coordination intact Normal gait  Psychiatric:        Behavior: Behavior normal.    ED Treatments / Results  Labs (  all labs ordered are listed, but only abnormal results are displayed) Labs Reviewed  BASIC METABOLIC PANEL - Abnormal; Notable for the following components:      Result Value   Potassium 3.1 (*)    CO2 21 (*)    Glucose, Bld 109 (*)    Calcium 8.8 (*)    All other components within normal limits  CBC WITH DIFFERENTIAL/PLATELET  BRAIN NATRIURETIC PEPTIDE  TROPONIN I  PREGNANCY, URINE    EKG EKG Interpretation  Date/Time:  Thursday October 11 2018 16:22:16 EDT Ventricular Rate:  141 PR Interval:    QRS Duration: 93 QT Interval:  260 QTC Calculation: 397 R Axis:   -12 Text Interpretation:  Sinus tachycardia Atrial premature complex Low voltage, extremity and precordial leads Repol abnrm suggests ischemia, diffuse leads Baseline wander in lead(s) II III aVL aVF V2 V5 V6 Confirmed by Blane OharaZavitz, Joshua [phone]([id number]) on 10/11/2018 4:24:59 PM   Radiology Ct Angio Chest Pe W And/or Wo Contrast  Result Date: 10/11/2018 CLINICAL DATA:  Shortness of breath for few days, chest pressure. Suspect pulmonary embolism. EXAM: CT ANGIOGRAPHY CHEST WITH CONTRAST TECHNIQUE: Multidetector CT imaging of the chest was performed using the standard protocol during bolus administration of intravenous contrast. Multiplanar CT image reconstructions and MIPs were obtained to evaluate the vascular anatomy. CONTRAST:  59 cc OMNIPAQUE IOHEXOL 350 MG/ML SOLN COMPARISON:  Chest radiograph October 11, 2018. FINDINGS: CARDIOVASCULAR: Adequate contrast opacification of the pulmonary artery's. Main pulmonary artery is not  enlarged. No pulmonary arterial filling defects to the level of the subsegmental branches. Heart size is normal, no right heart strain. No pericardial effusion. Thoracic aorta is normal course and caliber, unremarkable. MEDIASTINUM/NODES: No lymphadenopathy by CT size criteria. Small volume residual/reactivated thymus. LUNGS/PLEURA: Tracheobronchial tree is patent, no pneumothorax. No pleural effusions, focal consolidations, pulmonary nodules or masses. UPPER ABDOMEN: Non-acute. MUSCULOSKELETAL: Non-acute. Review of the MIP images confirms the above findings. IMPRESSION: Normal CTA chest. Electronically Signed   By: Awilda Metroourtnay  Bloomer M.D.   On: 10/11/2018 19:12   Dg Chest Portable 1 View  Result Date: 10/11/2018 CLINICAL DATA:  Initial evaluation for acute shortness of breath. EXAM: PORTABLE CHEST 1 VIEW COMPARISON:  Prior radiograph from 07/10/2018. FINDINGS: The cardiac and mediastinal silhouettes are stable in size and contour, and remain within normal limits. The lungs are mildly hypoinflated. No airspace consolidation, pleural effusion, or pulmonary edema is identified. There is no pneumothorax. No acute osseous abnormality identified. IMPRESSION: No active cardiopulmonary disease. Electronically Signed   By: Rise MuBenjamin  McClintock M.D.   On: 10/11/2018 18:21    Procedures Procedures (including critical care time)  Medications Ordered in ED Medications  sodium chloride 0.9 % bolus 1,000 mL (0 mLs Intravenous Stopped 10/11/18 1816)  iohexol (OMNIPAQUE) 350 MG/ML injection 100 mL (100 mLs Intravenous Contrast Given 10/11/18 1841)  acetaminophen (TYLENOL) tablet 650 mg (650 mg Oral Given 10/11/18 1926)  sodium chloride 0.9 % bolus 500 mL (500 mLs Intravenous New Bag/Given 10/11/18 1927)     Initial Impression / Assessment and Plan / ED Course  I have reviewed the triage vital signs and the nursing notes.  Pertinent labs & imaging results that were available during my care of the patient were reviewed by  me and considered in my medical decision making (see chart for details).  Clinical Course as of Oct 11 2010  Thu Oct 11, 2018  1759 No labs have resulted. Discussed lab delay with charge RN; will discuss with lab.   [BM]  Clinical Course User Index [BM] Bill Salinas, PA-C   37 year old female arrives with 7 days of shortness of breath, 2 days of central chest pressure, appears pleuritic.  Patient is tachycardic up to 140 bpm, no hypoxia on room air.  No history of cough.  Patient is overall resting comfortably despite her tachycardia, denies albuterol use.  Will proceed with blood work, p ortable chest x-ray and  CT angios for evaluation of pulmonary embolism, differential among other things includes viral pneumonia, PPE were used during evaluation. Case discussed with Dr. Cherre Robins.  Physical examination aside from tachycardia is overall unremarkable.  Lungs clear to auscultation bilaterally.  No murmurs heard.  Abdominal soft and nontender without distention or peritoneal signs. Normal appearing extremities.  Airway patent.  No signs of peritonsillar abscess, retropharyngeal abscess, Ludwig's angina or other deep tissue infections of the head or neck.  She is tolerating p.o. without difficulty.  Patient is stable appearing. ------------------------- Troponin negative BNP within normal limits BMP with potassium of 3.1, will discuss nutritional supplementation with the patient Urine pregnancy negative CBC within normal limits EKG sinus tachycardia reviewed with Dr. Jodi Mourning Chest x-ray:  IMPRESSION:  No active cardiopulmonary disease.   CT Angio:    IMPRESSION:  Normal CTA chest.  ================ Fluid bolus and Tylenol were given.  Patient's tachycardia has resolved and she reports her SOB has improved.  Suspect patient's tachycardia related to dehydration.  SPO2 has remained 100% on room air throughout visit.  I have reevaluated the patient she is resting comfortably walking in the  room and requesting discharge. Discussed with Dr. Jodi Mourning who agrees with discharge at this time.  I have encouraged patient to increase her water intake.  Plenty of rest.  OTC Tylenol.  Self quarantine and PCP follow-up.  Return for new or worsening symptoms.  This patient was evaluated in the context of the global COVID-19 pandemic, which necessitated consideration that the patient might be at risk for infection with the SARS-CoV-2 virus that causes COVID-19. Institutional protocols and algorithms that pertain to the evaluation of patients at risk for COVID-19 are in a state of rapid change based on information released by regulatory bodies including the CDC and federal and state organizations. These policies and algorithms were followed during the patient's care in the ED. she is with mild fever, no cough, shortness of breath has improved following fluid bolus here today.  No recent travel or known positive novel coronavirus contacts.  Patient appears appropriate for outpatient treatment at this time.  Testing for COVID-19 not indicated at this time per guidelines.  At this time there does not appear to be any evidence of an acute emergency medical condition and the patient appears stable for discharge with appropriate outpatient follow up. Diagnosis was discussed with patient who verbalizes understanding of care plan and is agreeable to discharge. I have discussed return precautions with patient who verbalize understanding of return precautions. Patient encouraged to follow-up with their PCP. All questions answered.  Patient has been discharged in good condition.  Patient's case rediscussed with Dr. Jodi Mourning who agrees with plan to discharge with follow-up.   Note: Portions of this report may have been transcribed using voice recognition software. Every effort was made to ensure accuracy; however, inadvertent computerized transcription errors may still be present. Final Clinical Impressions(s) / ED  Diagnoses   Final diagnoses:  Viral upper respiratory illness    ED Discharge Orders    None       Bill Salinas,  PA-C 10/11/18 2019    Bill Salinas, PA-C 10/11/18 2025    Blane Ohara, MD 10/16/18 1131    Blane Ohara, MD 10/16/18 [phone]

## 2018-10-11 NOTE — ED Notes (Signed)
Pt appears, anxious, clear speech, no sob noted or cough. Pt keeps requesting to be off of cardiac monitor. States it make her feel anxious. Educated pt on need for MD to monitor vs. Pt more calm after IVF started.

## 2018-10-11 NOTE — ED Notes (Signed)
PT states understanding of care given, follow up care, and medication prescribed. PT ambulated from ED to car with a steady gait. 

## 2018-10-11 NOTE — ED Triage Notes (Signed)
Pt reports SOB with exertion for a few days. Also endorses chest pressure. Denies cough. No known covid exposure.

## 2019-10-13 ENCOUNTER — Emergency Department (HOSPITAL_BASED_OUTPATIENT_CLINIC_OR_DEPARTMENT_OTHER): Payer: Medicaid Other

## 2019-10-13 ENCOUNTER — Encounter (HOSPITAL_BASED_OUTPATIENT_CLINIC_OR_DEPARTMENT_OTHER): Payer: Self-pay | Admitting: *Deleted

## 2019-10-13 ENCOUNTER — Other Ambulatory Visit: Payer: Self-pay

## 2019-10-13 ENCOUNTER — Emergency Department (HOSPITAL_BASED_OUTPATIENT_CLINIC_OR_DEPARTMENT_OTHER)
Admission: EM | Admit: 2019-10-13 | Discharge: 2019-10-13 | Disposition: A | Discharge status: Home / Self Care | Payer: Medicaid Other | Attending: Emergency Medicine | Admitting: Emergency Medicine

## 2019-10-13 DIAGNOSIS — M546 Pain in thoracic spine: Secondary | ICD-10-CM | POA: Diagnosis not present

## 2019-10-13 DIAGNOSIS — R0789 Other chest pain: Secondary | ICD-10-CM | POA: Insufficient documentation

## 2019-10-13 DIAGNOSIS — F419 Anxiety disorder, unspecified: Secondary | ICD-10-CM | POA: Insufficient documentation

## 2019-10-13 LAB — CBC
HCT: 39.2 % (ref 36.0–46.0)
Hemoglobin: 12.8 g/dL (ref 12.0–15.0)
MCH: 26.8 pg (ref 26.0–34.0)
MCHC: 32.7 g/dL (ref 30.0–36.0)
MCV: 82.2 fL (ref 80.0–100.0)
Platelets: 365 10*3/uL (ref 150–400)
RBC: 4.77 MIL/uL (ref 3.87–5.11)
RDW: 14.5 % (ref 11.5–15.5)
WBC: 10.4 10*3/uL (ref 4.0–10.5)
nRBC: 0 % (ref 0.0–0.2)

## 2019-10-13 LAB — BASIC METABOLIC PANEL
Anion gap: 12 (ref 5–15)
BUN: 14 mg/dL (ref 6–20)
CO2: 23 mmol/L (ref 22–32)
Calcium: 9.4 mg/dL (ref 8.9–10.3)
Chloride: 104 mmol/L (ref 98–111)
Creatinine, Ser: 0.96 mg/dL (ref 0.44–1.00)
GFR calc Af Amer: >60 mL/min (ref >60)
GFR calc non Af Amer: >60 mL/min (ref >60)
Glucose, Bld: 148 mg/dL — ABNORMAL HIGH (ref 70–99)
Potassium: 3.1 mmol/L — ABNORMAL LOW (ref 3.5–5.1)
Sodium: 139 mmol/L (ref 135–145)

## 2019-10-13 LAB — D-DIMER, QUANTITATIVE: D-Dimer, Quant: 0.61 ug/mL-FEU — ABNORMAL HIGH (ref 0.00–0.50)

## 2019-10-13 LAB — HCG, SERUM, QUALITATIVE: Preg, Serum: NEGATIVE

## 2019-10-13 MED ORDER — IOHEXOL 350 MG/ML SOLN
100.0000 mL | Freq: Once | INTRAVENOUS | Status: AC | PRN
  Administered 2019-10-13: 72 mL via INTRAVENOUS

## 2019-10-13 MED ORDER — METHOCARBAMOL 500 MG PO TABS: 500.0000 mg | ORAL_TABLET | Freq: Three times a day (TID) | ORAL | 0 refills | Status: AC | PRN

## 2019-10-13 MED ORDER — SODIUM CHLORIDE 0.9 % IV BOLUS: 1000.0000 mL | Freq: Once | INTRAVENOUS | Status: DC

## 2019-10-13 MED ORDER — POTASSIUM CHLORIDE CRYS ER 20 MEQ PO TBCR
40.0000 meq | EXTENDED_RELEASE_TABLET | Freq: Once | ORAL | Status: AC
  Administered 2019-10-13: 40 meq via ORAL
  Filled 2019-10-13: qty 2

## 2019-10-13 MED ORDER — LIDOCAINE 5 % EX PTCH: 1.0000 | MEDICATED_PATCH | CUTANEOUS | 0 refills | Status: AC

## 2019-10-13 MED ORDER — POTASSIUM CHLORIDE CRYS ER 20 MEQ PO TBCR: 20.0000 meq | EXTENDED_RELEASE_TABLET | Freq: Every day | ORAL | 0 refills | Status: AC

## 2019-10-13 NOTE — Discharge Instructions (Signed)
You were seen in the emergency department today for back pain.  Your labs were overall reassuring, your potassium was somewhat low, we are sending you with a potassium supplement and diet recommendations.  The CT scan we performed to be able to get an image of your area pain did not show a blood clot or any other abnormalities.  Please continue to take the acid reflux medication your primary care provider prescribed.  We would also like you to try Robaxin, muscle relaxant, as well as lidoderm patch, topical numbing patch, to see if this helps with the discomfort:   - Robaxin is the muscle relaxer I have prescribed, this is meant to help with muscle tightness. Be aware that this medication may make you drowsy therefore the first time you take this it should be at a time you are in an environment where you can rest. Do not drive or operate heavy machinery when taking this medication. Do not drink alcohol or take other sedating medications with this medicine such as narcotics or benzodiazepines.   - Lidoderm patch-this is a topical patch to place directly over your area of pain once per day.  Remove within 12 hours.  You make take Tylenol per over the counter dosing with these medications.   We have prescribed you new medication(s) today. Discuss the medications prescribed today with your pharmacist as they can have adverse effects and interactions with your other medicines including over the counter and prescribed medications. Seek medical evaluation if you start to experience new or abnormal symptoms after taking one of these medicines, seek care immediately if you start to experience difficulty breathing, feeling of your throat closing, facial swelling, or rash as these could be indications of a more serious allergic reaction   Please follow-up with your primary care provider within 1 week.  Return to the emergency department for new or worsening symptoms or any other concerns.

## 2019-10-13 NOTE — ED Triage Notes (Addendum)
Intermittent left back pain x 1 month. Denies injury, denies urinary sx. Pt states she was in [place] at the end of March. HR 140s in triage. Pt states she her HR is elevated when she goes to ED due to anxiety

## 2019-10-13 NOTE — ED Provider Notes (Signed)
[facility] EMERGENCY DEPARTMENT Provider Note   CSN: [id number] Arrival date & time: 10/13/19  1418     History Chief Complaint  Patient presents with  . Back Pain    Elizabeth Goodman is a 38 y.o. female with a history of anxiety who presents to the ED with complaints of back pain intermittently x 1 month. Patient states the pain is located to her left mid back just below the shoulder blade area, at times radiates to the L lateral rib area. Pain is intermittent, occurs a few times per day, lasts a few minutes per episode. No specific triggers consistently, can happen when moving, still, after eating, or with deep breathing. Seems better if she lays down sometimes. Pain is sharp when present, no pain currently.  Saw her PCP who started a medicine for GERD which she states helps some but that the pain still happens. No recent change in activity or heavy lifting. Denies dyspnea, unilteral leg pain/swelling, hemoptysis, recent surgery/trauma, personal hx of cancer, or hx of DVT/PE. Recently traveled to [place], but flight was only a couple of hours, no longer travel. Currently received the Depo shot for birth control. Denies numbness,  weakness, saddle anesthesia, incontinence to bowel/bladder, fever, chills, IV drug use, dysuria, or hematuria.   Her HR was noted to be elevated in triage- she states this is because she is anxious and that her HR is always high in the ED. She takes a medication for anxiety PRN, but does not like how it makes her feel and does not want to take it now.       HPI     History reviewed. No pertinent past medical history.  There are no problems to display for this patient.   History reviewed. No pertinent surgical history.   OB History    Gravida  1   Para      Term      Preterm      AB      Living        SAB      TAB      Ectopic      Multiple      Live Births              History reviewed. No pertinent family  history.  Social History   Tobacco Use  . Smoking status: Never Smoker  . Smokeless tobacco: Never Used  Substance Use Topics  . Alcohol use: Yes    Comment: 2 drinks/ daily  . Drug use: Never    Home Medications Prior to Admission medications   Medication Sig Start Date End Date Taking? Authorizing Provider  naproxen (NAPROSYN) 500 MG tablet Take 1 tablet (500 mg total) by mouth 2 (two) times daily with a meal. As needed for pain 07/10/18   Linwood Dibbles, MD    Allergies    Patient has no known allergies.  Review of Systems   Review of Systems  Constitutional: Negative for chills and fever.  Respiratory: Negative for cough and shortness of breath.   Cardiovascular: Negative for chest pain and leg swelling.  Gastrointestinal: Negative for abdominal pain, blood in stool, constipation, diarrhea, nausea and vomiting.  Genitourinary: Negative for dysuria and hematuria.  Musculoskeletal: Positive for back pain.  Neurological: Negative for weakness and numbness.       Negative for incontinence.   Psychiatric/Behavioral: The patient is nervous/anxious.   All other systems reviewed and are negative.  Physical Exam Updated Vital Signs  BP (!) 150/101 (BP Location: Right Arm)   Pulse (!) 142 Comment: Stated this is baseline when at ER, has anxiety.  Temp 99.3 F (37.4 C) (Oral)   Resp 20   Ht 5\' 3"  (1.6 m)   Wt 74.9 kg   SpO2 100%   BMI 29.26 kg/m   Physical Exam Vitals and nursing note reviewed.  Constitutional:      General: She is not in acute distress.    Appearance: She is well-developed. She is not toxic-appearing.  HENT:     Head: Normocephalic and atraumatic.  Eyes:     General:        Right eye: No discharge.        Left eye: No discharge.     Conjunctiva/sclera: Conjunctivae normal.  Cardiovascular:     Rate and Rhythm: Regular rhythm. Tachycardia present.  Pulmonary:     Effort: Pulmonary effort is normal. No respiratory distress.     Breath sounds: Normal  breath sounds. No wheezing, rhonchi or rales.  Abdominal:     General: There is no distension.     Palpations: Abdomen is soft.     Tenderness: There is no abdominal tenderness. There is no guarding or rebound.  Musculoskeletal:        General: No tenderness.     Cervical back: Neck supple.     Right lower leg: No edema.     Left lower leg: No edema.     Comments: No midline spinal tenderness. No paraspinal muscle tenderness.   Skin:    General: Skin is warm and dry.     Findings: No rash.  Neurological:     Mental Status: She is alert.     Comments: Clear speech. Sensation grossly intact x 4. 5/5 symmetric grip strength and strength with plantar/dorsiflexion bilaterally.   Psychiatric:        Mood and Affect: Mood is anxious.    ED Results / Procedures / Treatments   Labs (all labs ordered are listed, but only abnormal results are displayed) Labs Reviewed  BASIC METABOLIC PANEL - Abnormal; Notable for the following components:      Result Value   Potassium 3.1 (*)    Glucose, Bld 148 (*)    All other components within normal limits  D-DIMER, QUANTITATIVE (NOT AT [facility]) - Abnormal; Notable for the following components:   D-Dimer, Quant 0.61 (*)    All other components within normal limits  CBC  HCG, SERUM, QUALITATIVE    EKG EKG Interpretation  Date/Time:  Sunday October 13 2019 14:37:45 EDT Ventricular Rate:  139 PR Interval:    QRS Duration: 78 QT Interval:  284 QTC Calculation: 432 R Axis:   55 Text Interpretation: Sinus tachycardia Ventricular premature complex Aberrant complex Abnormal R-wave progression, early transition Borderline T abnormalities, anterior leads since last tracing no significant change Confirmed by [id number] [phone]) on 10/13/2019 2:40:58 PM   Radiology CT Angio Chest PE W/Cm &/Or Wo Cm  Result Date: 10/13/2019 CLINICAL DATA:  Mid back pain. Epigastric pain. Recent travel to [place]. EXAM: CT ANGIOGRAPHY CHEST WITH CONTRAST TECHNIQUE:  Multidetector CT imaging of the chest was performed using the standard protocol during bolus administration of intravenous contrast. Multiplanar CT image reconstructions and MIPs were obtained to evaluate the vascular anatomy. CONTRAST:  [place] OMNIPAQUE IOHEXOL 350 MG/ML SOLN COMPARISON:  Radiograph and CT 10/11/2018, no interval chest imaging. FINDINGS: Cardiovascular: There are no filling defects within the pulmonary arteries to suggest pulmonary embolus. Normal  caliber thoracic aorta without dissection. Heart is normal in size. No pericardial effusion. Mediastinum/Nodes: No enlarged mediastinal or hilar lymph nodes. 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 Decompressed esophagus. No visualized thyroid nodule. Lungs/Pleura: Lungs are clear. No focal airspace disease, pulmonary edema, or pleural fluid. Trachea and mainstem bronchi are patent. Upper Abdomen: No acute findings. Musculoskeletal: There are no acute or suspicious osseous abnormalities. No musculoskeletal findings to explain back pain. Review of the MIP images confirms the above findings. IMPRESSION: Normal CTA of the chest. No pulmonary embolus or acute abnormality. Electronically Signed   By: Keith Rake M.D.   On: 10/13/2019 17:02    Procedures Procedures (including critical care time)  Medications Ordered in ED Medications - No data to display  ED Course  I have reviewed the triage vital signs and the nursing notes.  Pertinent labs & imaging results that were available during my care of the patient were reviewed by me and considered in my medical decision making (see chart for details).    MDM Rules/Calculators/A&P                     Patient presents to the ED with complaints of intermittent back pain x 1 month. She is nontoxic, resting comfortably, notably tachycardic on arrival- 120s on my assessment, patient states this always happens with her anxiety.  BP also somewhat elevated- doubt HTN emergency. EKG performed by triage team without significant change from prior. Other than tachycardia- benign physical exam. DDX: MSK, PE, GERD, pneumothorax. No midline tenderness or neuro deficits to raise concern for acute spinal cord compression/cauda equina.   I Ordered, reviewed, and interpreted labs, which included:  CBC: No significant anemia or leukocytosis.  BMP: Mild hypokalemia- will administer oral medication replacement in the ER and provide prescription for potassium supplement as well. Mild hyperglycemia.  Preg test: Negative.  D-dimer: Positive. I ordered imaging studies which included CTA- I independently visualized and interpreted imaging which showed no PE or other acute process.   HR improving in the ED.  Overall reassuring work-up.  Appears appropriate for discharge home. Will trial medications for MSK process with PCP follow up. I discussed results, treatment plan, need for follow-up, and return precautions with the patient. Provided opportunity for questions, patient confirmed understanding and is in agreement with plan.   Portions of this note were generated with [employer]. Dictation errors may occur despite best attempts at proofreading.  Findings and plan of care discussed with supervising physician Dr. Tamera Punt who is in agreement.   Vitals:   10/13/19 1705 10/13/19 1710  BP: (!) 134/92 (!) 134/92  Pulse: (!) 111 100  Resp: 16 16  Temp:    SpO2: 100% 97%    Final Clinical Impression(s) / ED Diagnoses Final diagnoses:  Left-sided thoracic back pain, unspecified chronicity    Rx / DC Orders ED Discharge Orders         Ordered    potassium chloride SA (KLOR-CON) 20 MEQ tablet  Daily     10/13/19 1709    methocarbamol (ROBAXIN) 500 MG tablet  Every 8 hours PRN     10/13/19 1709    lidocaine (LIDODERM) 5 %  Every 24 hours     10/13/19 1712           Tanishi Nault, Glynda Jaeger, PA-C 10/13/19 1720     Malvin Johns, MD 10/17/19 [phone]

## 2020-03-28 ENCOUNTER — Ambulatory Visit: Payer: Self-pay

## 2020-03-28 NOTE — Telephone Encounter (Signed)
Pt c/o sx since testing positive. Pt  c/o persistent sx (occasional cough and chest pain abd SOB with activity.)Pt tested positive in May and is having long term sx. Pt anxious and worries she "will never get better." Pt was seen in ED and pt stated no lung or heart issues and per pt the exam was normal. Pt has not f/u with PCP, but has upcoming appt this Tuesday. Pt asked if there was a post covid center that she could also go to. Sent a CRM to [facility].  Care advice given as well as coping mechanisms to help with anxiety.     Reason for Disposition . [1] PERSISTING SYMPTOMS OF COVID-19 AND [2] symptoms SAME AND [3] medical visit for COVID-19 in past 2 weeks  Answer Assessment - Initial Assessment Questions 1. COVID-19 ONSET: "When did the symptoms of COVID-19 first start?"     3-4 weeks 2. DIAGNOSIS CONFIRMATION: "How were you diagnosed?" (e.g., COVID-19 oral or nasal viral test; COVID-19 antibody test; doctor visit)     Covid 19 nasal PCR 3. MAIN SYMPTOM:  "What is your main concern or symptom right now?" (e.g., breathing difficulty, cough, fatigue. loss of smell)     SOB, chest pain 4. SYMPTOM ONSET: "When did the  SOB,chest pain  start?"     2-3 weeks ago 5. BETTER-SAME-WORSE: "Are you getting better, staying the same, or getting worse over the last 1 to 2 weeks?"     same 6. RECENT MEDICAL VISIT: "Have you been seen by a healthcare provider (doctor, NP, PA) for these persisting COVID-19 symptoms?" If Yes, ask: "When were you seen?" (e.g., date)     ER 03/25/20  7. COUGH: "Do you have a cough?" If Yes, ask: "How bad is the cough?"       Yes-occasional cough 8. FEVER: "Do you have a fever?" If Yes, ask: "What is your temperature, how was it measured, and when did it start?"     no  9. BREATHING DIFFICULTY: "Are you having any trouble breathing?" If Yes, ask: "How bad is your breathing?" (e.g., mild, moderate, severe)    - MILD: No SOB at rest, mild SOB with walking, speaks normally in  sentences, can lay down, no retractions, pulse < 100.    - MODERATE: SOB at rest, SOB with minimal exertion and prefers to sit, cannot lie down flat, speaks in phrases, mild retractions, audible wheezing, pulse 100-120.    - SEVERE: Very SOB at rest, speaks in single words, struggling to breathe, sitting hunched forward, retractions, pulse > 120       Mild- 10. HIGH RISK DISEASE: "Do you have any chronic medical problems?" (e.g., asthma, heart or lung disease, weak immune system, obesity, etc.)       no 11. PREGNANCY: "Is there any chance you are pregnant?" "When was your last menstrual period?"       No Depo- no periods 12. OTHER SYMPTOMS: "Do you have any other symptoms?"  (e.g., fatigue, headache, muscle pain, weakness)       Chest pain- shooting under breast- See in ED and worked up for heart issues. Takes Ibuprofen occasionally no relief  Protocols used: CORONAVIRUS (COVID-19) PERSISTING SYMPTOMS FOLLOW-UP CALL-A-AH

## 2020-03-30 ENCOUNTER — Telehealth: Payer: Self-pay | Admitting: Family Medicine

## 2020-03-30 NOTE — Telephone Encounter (Signed)
Pt was called to schedule appt w/ [facility]. lvm

## 2020-04-14 ENCOUNTER — Ambulatory Visit: Payer: Medicaid Other

## 2020-05-22 ENCOUNTER — Encounter (HOSPITAL_BASED_OUTPATIENT_CLINIC_OR_DEPARTMENT_OTHER): Payer: Self-pay | Admitting: Emergency Medicine

## 2020-05-22 ENCOUNTER — Other Ambulatory Visit: Payer: Self-pay

## 2020-05-22 ENCOUNTER — Emergency Department (HOSPITAL_BASED_OUTPATIENT_CLINIC_OR_DEPARTMENT_OTHER)
Admission: EM | Admit: 2020-05-22 | Discharge: 2020-05-22 | Disposition: A | Discharge status: Home / Self Care | Payer: Medicaid Other | Attending: Emergency Medicine | Admitting: Emergency Medicine

## 2020-05-22 ENCOUNTER — Emergency Department (HOSPITAL_BASED_OUTPATIENT_CLINIC_OR_DEPARTMENT_OTHER): Payer: Medicaid Other

## 2020-05-22 DIAGNOSIS — R42 Dizziness and giddiness: Secondary | ICD-10-CM | POA: Insufficient documentation

## 2020-05-22 DIAGNOSIS — R002 Palpitations: Secondary | ICD-10-CM | POA: Diagnosis present

## 2020-05-22 DIAGNOSIS — F419 Anxiety disorder, unspecified: Secondary | ICD-10-CM | POA: Diagnosis not present

## 2020-05-22 DIAGNOSIS — F458 Other somatoform disorders: Secondary | ICD-10-CM | POA: Insufficient documentation

## 2020-05-22 DIAGNOSIS — R0989 Other specified symptoms and signs involving the circulatory and respiratory systems: Secondary | ICD-10-CM

## 2020-05-22 LAB — COMPREHENSIVE METABOLIC PANEL
ALT: 11 U/L (ref 0–44)
AST: 17 U/L (ref 15–41)
Albumin: 4.1 g/dL (ref 3.5–5.0)
Alkaline Phosphatase: 62 U/L (ref 38–126)
Anion gap: 10 (ref 5–15)
BUN: 13 mg/dL (ref 6–20)
CO2: 23 mmol/L (ref 22–32)
Calcium: 9.1 mg/dL (ref 8.9–10.3)
Chloride: 105 mmol/L (ref 98–111)
Creatinine, Ser: 0.91 mg/dL (ref 0.44–1.00)
GFR, Estimated: >60 mL/min (ref >60)
Glucose, Bld: 107 mg/dL — ABNORMAL HIGH (ref 70–99)
Potassium: 3.3 mmol/L — ABNORMAL LOW (ref 3.5–5.1)
Sodium: 138 mmol/L (ref 135–145)
Total Bilirubin: 0.4 mg/dL (ref 0.3–1.2)
Total Protein: 8 g/dL (ref 6.5–8.1)

## 2020-05-22 LAB — CBC WITH DIFFERENTIAL/PLATELET
Abs Immature Granulocytes: 0.05 10*3/uL (ref 0.00–0.07)
Basophils Absolute: 0 10*3/uL (ref 0.0–0.1)
Basophils Relative: 0 %
Eosinophils Absolute: 0.1 10*3/uL (ref 0.0–0.5)
Eosinophils Relative: 1 %
HCT: 36.2 % (ref 36.0–46.0)
Hemoglobin: 11.9 g/dL — ABNORMAL LOW (ref 12.0–15.0)
Immature Granulocytes: 0 %
Lymphocytes Relative: 15 %
Lymphs Abs: 1.8 10*3/uL (ref 0.7–4.0)
MCH: 26.7 pg (ref 26.0–34.0)
MCHC: 32.9 g/dL (ref 30.0–36.0)
MCV: 81.2 fL (ref 80.0–100.0)
Monocytes Absolute: 0.5 10*3/uL (ref 0.1–1.0)
Monocytes Relative: 4 %
Neutro Abs: 9.3 10*3/uL — ABNORMAL HIGH (ref 1.7–7.7)
Neutrophils Relative %: 80 %
Platelets: 339 10*3/uL (ref 150–400)
RBC: 4.46 MIL/uL (ref 3.87–5.11)
RDW: 14.9 % (ref 11.5–15.5)
WBC: 11.8 10*3/uL — ABNORMAL HIGH (ref 4.0–10.5)
nRBC: 0 % (ref 0.0–0.2)

## 2020-05-22 LAB — PREGNANCY, URINE: Preg Test, Ur: NEGATIVE

## 2020-05-22 LAB — MAGNESIUM: Magnesium: 2.1 mg/dL (ref 1.7–2.4)

## 2020-05-22 LAB — TROPONIN I (HIGH SENSITIVITY)
Troponin I (High Sensitivity): <2 ng/L (ref <18)
Troponin I (High Sensitivity): 3 ng/L (ref <18)

## 2020-05-22 LAB — TSH: TSH: 1.211 u[IU]/mL (ref 0.350–4.500)

## 2020-05-22 LAB — D-DIMER, QUANTITATIVE: D-Dimer, Quant: 0.49 ug/mL-FEU (ref 0.00–0.50)

## 2020-05-22 LAB — LIPASE, BLOOD: Lipase: 23 U/L (ref 11–51)

## 2020-05-22 MED ORDER — LORAZEPAM 2 MG/ML IJ SOLN: 1.0000 mg | Freq: Once | INTRAMUSCULAR | Status: DC

## 2020-05-22 MED ORDER — SODIUM CHLORIDE 0.9 % IV BOLUS
1000.0000 mL | Freq: Once | INTRAVENOUS | Status: AC
  Administered 2020-05-22: 1000 mL via INTRAVENOUS

## 2020-05-22 NOTE — ED Notes (Signed)
Pt given crackers and ice water for PO challenge.

## 2020-05-22 NOTE — ED Provider Notes (Signed)
[facility] EMERGENCY DEPARTMENT Provider Note   CSN: [id number] Arrival date & time: 05/22/20  1011     History Chief Complaint  Patient presents with  . Palpitations    Wilmon PaliJamesha Hanisch is a 39 y.o. female with past medical history significant for anxiety who presents for evaluation of multiple complaints.  Patient states over the last 2 days while eating solid food she feels like it "wont down" however has been able to ultimately swallow.  She denies any sensation of throat closing.  She is able to drink liquids as well as tolerate her oral secretions without difficulty.  No rash, new lotions, perfumes, foods, facial swelling, tongue swelling, pain swelling.  Patient also states prior to arrival she developed pain under her left breast which went into her left shoulder blade.  Patient states her left breast pain resolved however she still has pain in her left shoulder.  No unilateral leg swelling, redness or warmth.  No hemoptysis, belching.  No cough.  No prior history of PE or DVT.  Patient states when her pain began she felt lightheaded, numbness and tingling in bilateral hands.  She felt like her heart was racing and she could not catch her breath.  Patient states she does have history of anxiety.  She is on BuSpar as needed.  She took a half a tablet earlier today.  Patient states she has not needed her anxiety medication in " weeks. " She denies any SI, HI, AVH.  No fever, chills, nausea, vomiting, hemoptysis, abdominal pain, diarrhea, dysuria, weakness.  Denies additional aggravating or relieving factors.  History obtained from patient and past medical records.  No interpreter is used.  HPI     History reviewed. No pertinent past medical history.  There are no problems to display for this patient.   History reviewed. No pertinent surgical history.   OB History    Gravida  1   Para      Term      Preterm      AB      Living        SAB      TAB       Ectopic      Multiple      Live Births              No family history on file.  Social History   Tobacco Use  . Smoking status: Never Smoker  . Smokeless tobacco: Never Used  Vaping Use  . Vaping Use: Never assessed  Substance Use Topics  . Alcohol use: Yes    Comment: 2 drinks/ daily  . Drug use: Never    Home Medications Prior to Admission medications   Medication Sig Start Date End Date Taking? Authorizing Provider  lidocaine (LIDODERM) 5 % Place 1 patch onto the skin daily. Apply 1 patch to your back in area of most pain once per day. Remove & Discard patch within 12 hours of application. 10/13/19   Petrucelli, Samantha R, PA-C  methocarbamol (ROBAXIN) 500 MG tablet Take 1 tablet (500 mg total) by mouth every 8 (eight) hours as needed. 10/13/19   Petrucelli, Samantha R, PA-C  naproxen (NAPROSYN) 500 MG tablet Take 1 tablet (500 mg total) by mouth 2 (two) times daily with a meal. As needed for pain 07/10/18   Linwood DibblesKnapp, Jon, MD  potassium chloride SA (KLOR-CON) 20 MEQ tablet Take 1 tablet (20 mEq total) by mouth daily. 10/13/19   Petrucelli, Pleas KochSamantha R,  PA-C    Allergies    Patient has no known allergies.  Review of Systems   Review of Systems  Constitutional: Negative.   HENT: Positive for trouble swallowing. Negative for congestion, dental problem, drooling, ear discharge, ear pain, facial swelling, hearing loss, mouth sores, nosebleeds, postnasal drip, rhinorrhea, sinus pressure, sinus pain, sneezing, sore throat and voice change.   Respiratory: Negative.   Cardiovascular: Positive for chest pain and palpitations. Negative for leg swelling.  Gastrointestinal: Negative.   Genitourinary: Negative.   Musculoskeletal: Negative.   Skin: Negative.   Neurological: Positive for light-headedness and numbness (Tingling in BL hands, resolved PTA). Negative for dizziness, tremors, seizures, syncope, facial asymmetry, speech difficulty, weakness and headaches.  All other systems  reviewed and are negative.   Physical Exam Updated Vital Signs BP 127/90   Pulse (!) 101   Temp 99.2 F (37.3 C) (Oral)   Resp 18   Ht 5\' 3"  (1.6 m)   Wt 72.6 kg   SpO2 100%   BMI 28.34 kg/m   Physical Exam Physical Exam  Constitutional: Pt is oriented to person, place, and time. Pt appears well-developed and well-nourished. No distress.  HENT:  Head: Normocephalic and atraumatic.  Mouth/Throat: Oropharynx is clear and moist.  Eyes: Conjunctivae and EOM are normal. Pupils are equal, round, and reactive to light. No scleral icterus.  No horizontal, vertical or rotational nystagmus  Neck: Normal range of motion. Neck supple.  Full active and passive ROM without pain No midline or paraspinal tenderness No nuchal rigidity or meningeal signs  Cardiovascular: Normal rate, regular rhythm and intact distal pulses.   Pulmonary/Chest: Effort normal and breath sounds normal. No respiratory distress. Pt has no wheezes. No rales. Mild tenderness to posterior left scapula. Abdominal: Soft. Bowel sounds are normal. There is no tenderness. There is no rebound and no guarding.  Musculoskeletal: Normal range of motion.  Lymphadenopathy:    No cervical adenopathy.  Neurological: Pt. is alert and oriented to person, place, and time. He has normal reflexes. No cranial nerve deficit.  Exhibits normal muscle tone. Coordination normal.  Mental Status:  Alert, oriented, thought content appropriate. Speech fluent without evidence of aphasia. Able to follow 2 step commands without difficulty.  Cranial Nerves:  II:  Peripheral visual fields grossly normal, pupils equal, round, reactive to light III,IV, VI: ptosis not present, extra-ocular motions intact bilaterally  V,VII: smile symmetric, facial light touch sensation equal VIII: hearing grossly normal bilaterally  IX,X: midline uvula rise  XI: bilateral shoulder shrug equal and strong XII: midline tongue extension  Motor:  5/5 in upper and lower  extremities bilaterally including strong and equal grip strength and dorsiflexion/plantar flexion Sensory: Pinprick and light touch normal in all extremities.  Deep Tendon Reflexes: 2+ and symmetric  Cerebellar: normal finger-to-nose with bilateral upper extremities Gait: normal gait and balance CV: distal pulses palpable throughout   Skin: Skin is warm and dry. No rash noted. Pt is not diaphoretic.  Psychiatric: Pt has a normal mood and affect. Behavior is normal. Judgment and thought content normal.  Nursing note and vitals reviewed. ED Results / Procedures / Treatments   Labs (all labs ordered are listed, but only abnormal results are displayed) Labs Reviewed  CBC WITH DIFFERENTIAL/PLATELET - Abnormal; Notable for the following components:      Result Value   WBC 11.8 (*)    Hemoglobin 11.9 (*)    Neutro Abs 9.3 (*)    All other components within normal limits  COMPREHENSIVE METABOLIC  PANEL - Abnormal; Notable for the following components:   Potassium 3.3 (*)    Glucose, Bld 107 (*)    All other components within normal limits  LIPASE, BLOOD  D-DIMER, QUANTITATIVE (NOT AT [facility])  PREGNANCY, URINE  MAGNESIUM  TSH  TROPONIN I (HIGH SENSITIVITY)  TROPONIN I (HIGH SENSITIVITY)    EKG None  Radiology DG Chest 2 View  Result Date: 05/22/2020 CLINICAL DATA:  Shortness of breath. EXAM: CHEST - 2 VIEW COMPARISON:  July 10, 2018. FINDINGS: The heart size and mediastinal contours are within normal limits. Both lungs are clear. No pneumothorax or pleural effusion is noted. The visualized skeletal structures are unremarkable. IMPRESSION: No active cardiopulmonary disease. Electronically Signed   By: Lupita Raider M.D.   On: 05/22/2020 11:38    Procedures Procedures (including critical care time)  Medications Ordered in ED Medications  LORazepam (ATIVAN) injection 1 mg (1 mg Intravenous Refused 05/22/20 1119)  sodium chloride 0.9 % bolus 1,000 mL (0 mLs Intravenous Stopped  05/22/20 1308)    ED Course  I have reviewed the triage vital signs and the nursing notes.  Pertinent labs & imaging results that were available during my care of the patient were reviewed by me and considered in my medical decision making (see chart for details).  39 year old presents for evaluation multiple complaints.  She is afebrile, nonseptic, non-ill-appearing.  Patient with globulus sensation twice while eating solid food over the last 2 days.  She is able to tolerate her secretions as well as p.o. liquid intake.  No prior history of esophageal strictures.  No GERD symptoms.  States she does get anxious when she feels like she cannot swallow.  Patient states driving to the emergency department she felt sudden onset chest pain, shortness of breath, lightheadedness, tingling in bilateral hands.  Does have some mild tenderness time able to reproduce on palpation to her left scapula.  She has no overlying skin changes.  She has nonfocal neuro exam without deficits.  Chest pain resolved PTA.  Labs and imaging personally reviewed and interpreted:  CBC leukocytosis at 11.8 Metabolic panel mild hypokalemia at 3.3 however no additional ultralight, renal abnormality Magnesium 2.1 D-dimer 0.49 Lipase 23 Troponin flat <2, 3 Preg negative DG chest without significant findings EKG without ST changes  Patient assessed.  Was able to tolerate crackers as well as p.o. liquids.  Heart rate has significantly improved.  Feel symptoms likely due to anxiety.  Instructed her to take her BuSpar on a regular basis.  She does have follow-up with GI on Tuesday for endoscopy/colonoscopy per patient.  Discussed close follow-up outpatient.  I have low suspicion for acute ACS, PE, dissection, bacterial infectious process, Borhaave as cause of her symptoms earlier today.  The patient has been appropriately medically screened and/or stabilized in the ED. I have low suspicion for any other emergent medical condition  which would require further screening, evaluation or treatment in the ED or require inpatient management.  Patient is hemodynamically stable and in no acute distress.  Patient able to ambulate in department prior to ED.  Evaluation does not show acute pathology that would require ongoing or additional emergent interventions while in the emergency department or further inpatient treatment.  I have discussed the diagnosis with the patient and answered all questions.  Pain is been managed while in the emergency department and patient has no further complaints prior to discharge.  Patient is comfortable with plan discussed in room and is stable for discharge at  this time.  I have discussed strict return precautions for returning to the emergency department.  Patient was encouraged to follow-up with PCP/specialist refer to at discharge.    MDM Rules/Calculators/A&P                           Final Clinical Impression(s) / ED Diagnoses Final diagnoses:  Globus sensation  Anxiety    Rx / DC Orders ED Discharge Orders    None       Clydell Sposito A, PA-C 05/22/20 1409    Benjiman Core, MD 05/22/20 [phone]

## 2020-05-22 NOTE — Discharge Instructions (Signed)
Take your Buspar on a regular basis  Keep your follow up appointment for your Endoscopy.  Return for new or worsening symptoms

## 2020-05-22 NOTE — ED Triage Notes (Signed)
Reports having a rushed feeling while eating the last two days making it hard to swallow.  Also c/o some palpitations.  Endorses pain under left breast that goes into left upper back.  Endorses being on buspar for anxiety.  Took 1/2 a pill today.  Reports she hasn't needed it in weeks.

## 2020-05-22 NOTE — ED Notes (Signed)
Pt tolerated PO challenge

## 2020-09-27 IMAGING — CT CT ANGIO CHEST
2 of 10 series · 18 of 36 positions shown · IV contrast (Omnipaque)
Comparison: Radiograph and CT 10/11/2018, no interval chest
imaging.

CLINICAL DATA: Mid back pain. Epigastric pain. Recent travel to
[place].

EXAM:
CT ANGIOGRAPHY CHEST WITH CONTRAST
TECHNIQUE: Multidetector CT imaging of the chest was performed using the
standard protocol during bolus administration of intravenous
contrast. Multiplanar CT image reconstructions and MIPs were
obtained to evaluate the vascular anatomy.
CONTRAST:  100mL OMNIPAQUE IOHEXOL 350 MG/ML SOLN

[Series 8: pe thins · axial · 0.72mm/px · z∈[-282,+0]mm · 17 of 318 slices shown]
[im 18/318  lung]
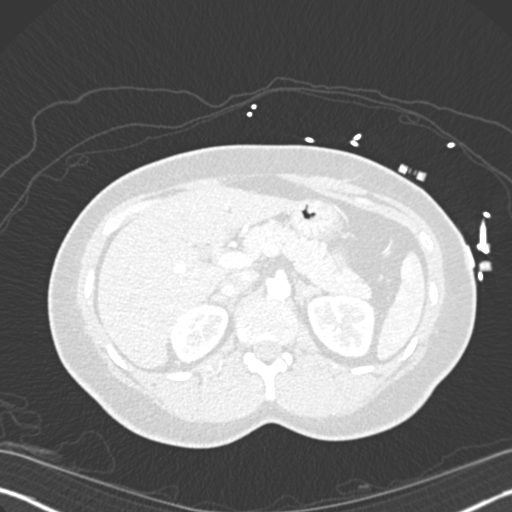
[im 36/318  mediastinal]
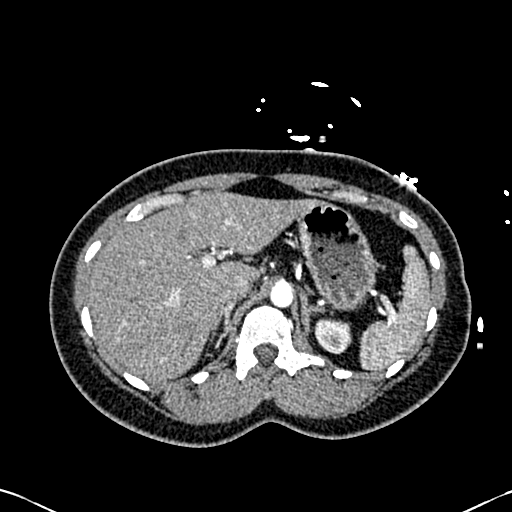
[im 53/318  lung]
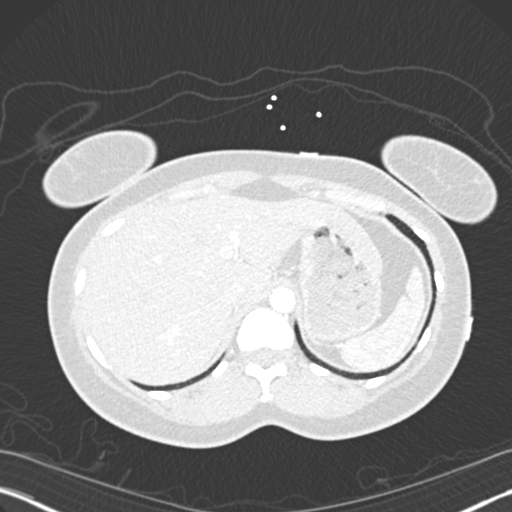
[im 71/318  mediastinal]
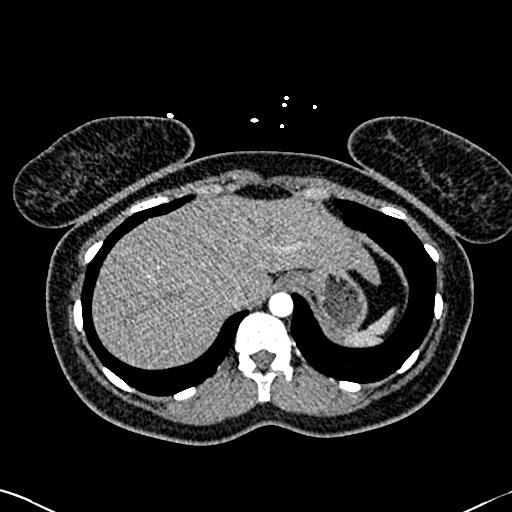
[im 89/318  lung]
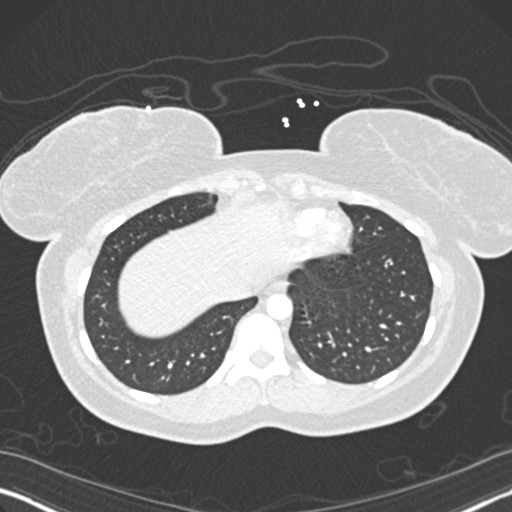
[im 106/318  mediastinal]
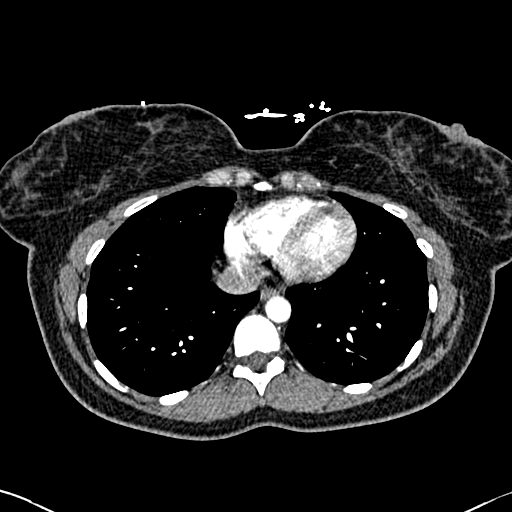
[im 124/318  lung]
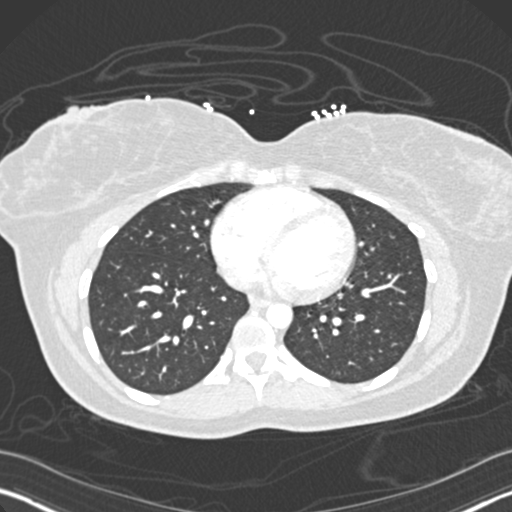
[im 141/318  mediastinal]
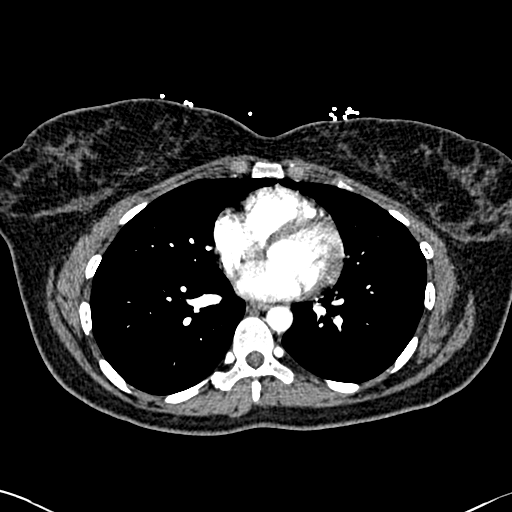
[im 159/318  lung]
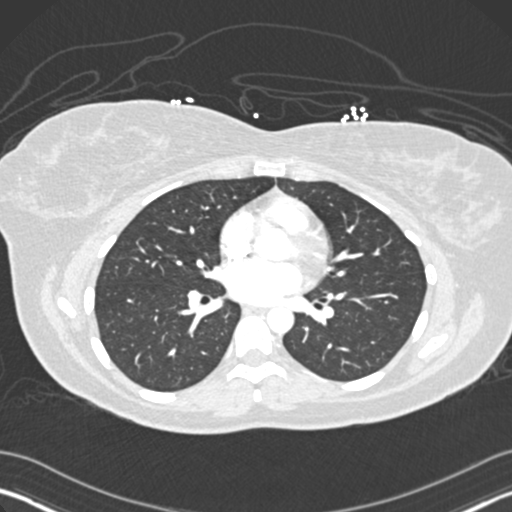
[im 177/318  mediastinal]
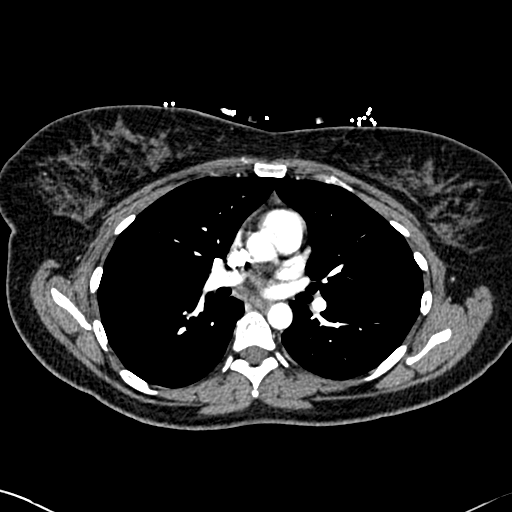
[im 194/318  lung]
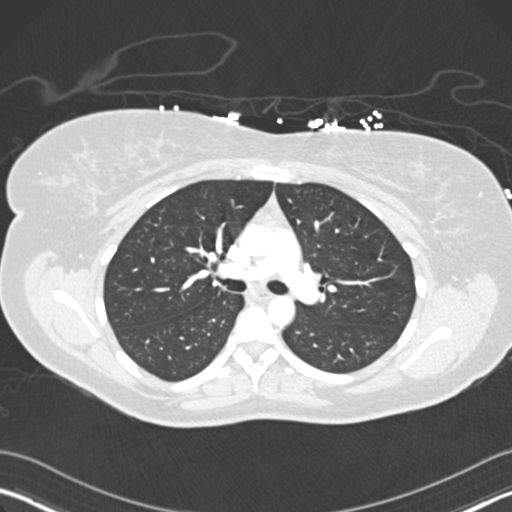
[im 212/318  mediastinal]
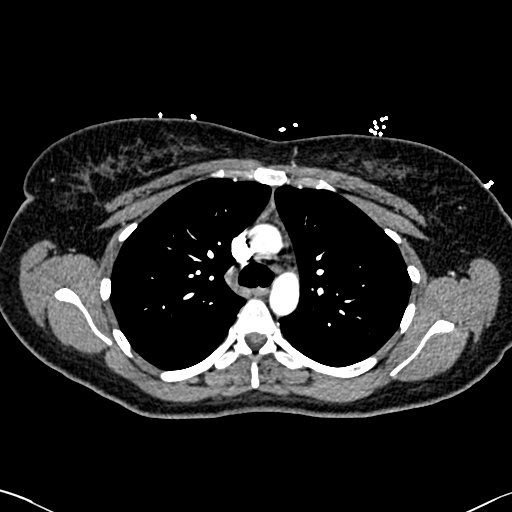
[im 229/318  lung]
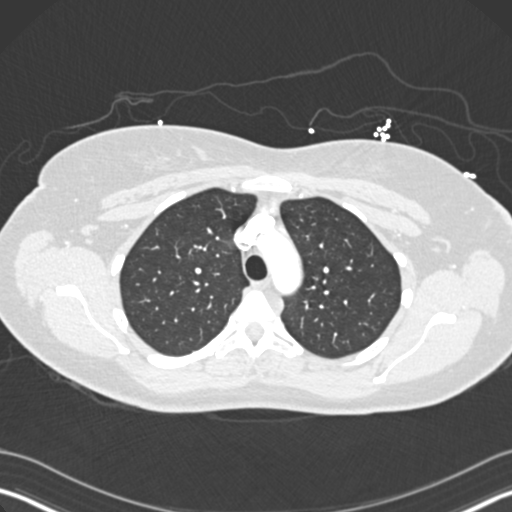
[im 247/318  mediastinal]
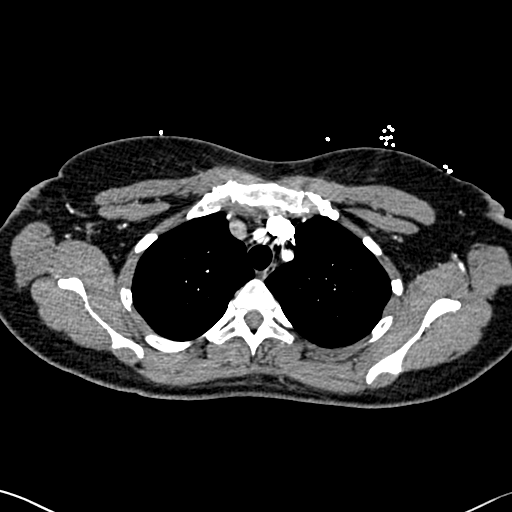
[im 265/318  lung]
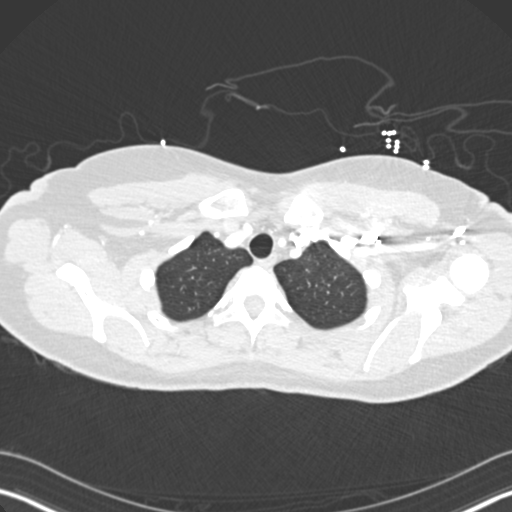
[im 282/318  mediastinal]
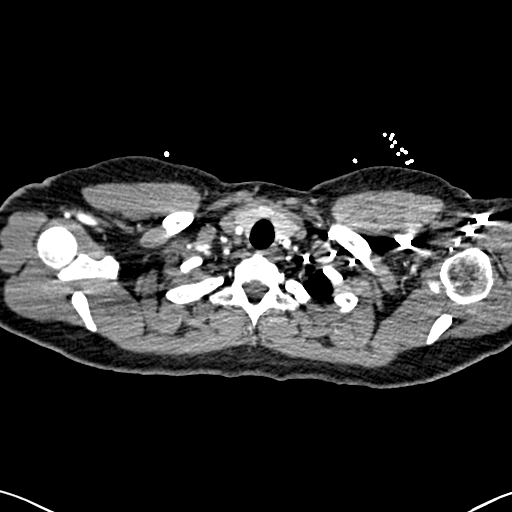
[im 300/318  lung]
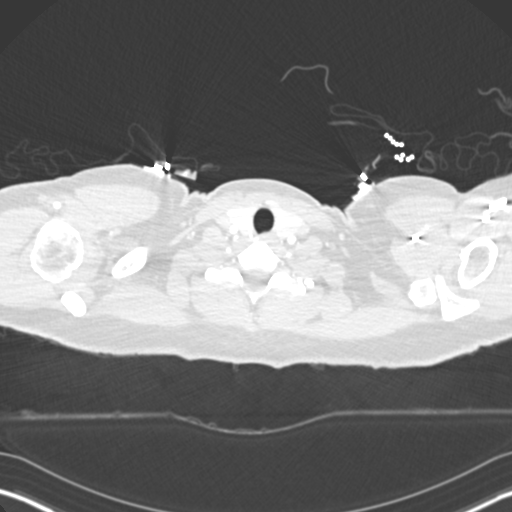

[Series 9: pe coronal mpr · coronal · 0.63mm/px · 1 of 142 slices shown]
[im 71/142  mediastinal]
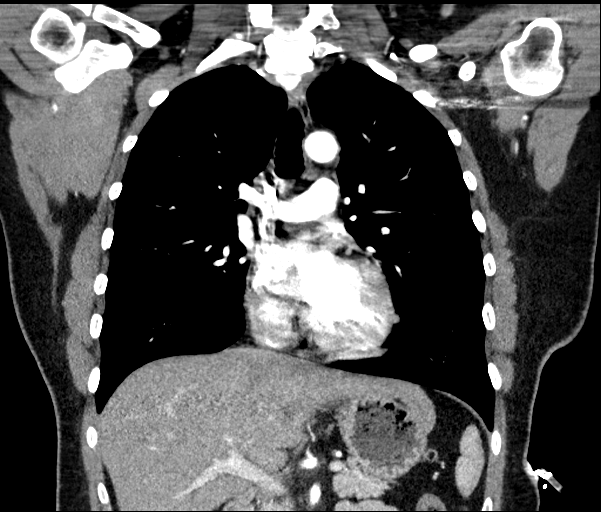

[18 of 36 positions shown; findings below may reference images not displayed]

FINDINGS: Cardiovascular: There are no filling defects within the pulmonary
arteries to suggest pulmonary embolus. Normal caliber thoracic aorta
without dissection. Heart is normal in size. No pericardial
effusion.

Mediastinum/Nodes: No enlarged mediastinal or hilar lymph nodes.
Minimal soft tissue density in the anterior mediastinum consistent
with residual or recurrent thymus, unchanged. Decompressed
esophagus. No visualized thyroid nodule.

Lungs/Pleura: Lungs are clear. No focal airspace disease, pulmonary
edema, or pleural fluid. Trachea and mainstem bronchi are patent.

Upper Abdomen: No acute findings.

Musculoskeletal: There are no acute or suspicious osseous
abnormalities. No musculoskeletal findings to explain back pain.

Review of the MIP images confirms the above findings.
IMPRESSION: Normal CTA of the chest. No pulmonary embolus or acute abnormality.
